# Patient Record
Sex: Female | Born: 1990 | Race: Black or African American | Hispanic: No | Marital: Single | Smoking: Never smoker
Health system: Southern US, Community
[De-identification: ages and names within clinical notes are randomized; demographics above are authoritative.]

## PROBLEM LIST (undated history)

## (undated) ENCOUNTER — Emergency Department: Admission: EM | Payer: Self-pay

## (undated) DIAGNOSIS — R519 Headache, unspecified: Secondary | ICD-10-CM

## (undated) DIAGNOSIS — N83209 Unspecified ovarian cyst, unspecified side: Secondary | ICD-10-CM

---

## 2017-01-06 ENCOUNTER — Emergency Department (HOSPITAL_BASED_OUTPATIENT_CLINIC_OR_DEPARTMENT_OTHER)
Admission: EM | Admit: 2017-01-06 | Discharge: 2017-01-06 | Disposition: A | Discharge status: Home / Self Care | Payer: Self-pay | Attending: Emergency Medicine | Admitting: Emergency Medicine

## 2017-01-06 ENCOUNTER — Emergency Department (HOSPITAL_BASED_OUTPATIENT_CLINIC_OR_DEPARTMENT_OTHER): Payer: Self-pay

## 2017-01-06 ENCOUNTER — Encounter (HOSPITAL_BASED_OUTPATIENT_CLINIC_OR_DEPARTMENT_OTHER): Payer: Self-pay | Admitting: Emergency Medicine

## 2017-01-06 DIAGNOSIS — N390 Urinary tract infection, site not specified: Secondary | ICD-10-CM | POA: Insufficient documentation

## 2017-01-06 DIAGNOSIS — G43109 Migraine with aura, not intractable, without status migrainosus: Secondary | ICD-10-CM

## 2017-01-06 DIAGNOSIS — G43809 Other migraine, not intractable, without status migrainosus: Secondary | ICD-10-CM | POA: Insufficient documentation

## 2017-01-06 HISTORY — DX: Unspecified ovarian cyst, unspecified side: N83.209

## 2017-01-06 LAB — PREGNANCY, URINE: Preg Test, Ur: NEGATIVE

## 2017-01-06 LAB — URINALYSIS, ROUTINE W REFLEX MICROSCOPIC
BILIRUBIN URINE: NEGATIVE
GLUCOSE, UA: NEGATIVE mg/dL
Hgb urine dipstick: NEGATIVE
KETONES UR: 15 mg/dL — AB
Nitrite: NEGATIVE
PH: 6 (ref 5.0–8.0)
Protein, ur: 100 mg/dL — AB
Specific Gravity, Urine: >1.03 — ABNORMAL HIGH (ref 1.005–1.030)

## 2017-01-06 LAB — URINALYSIS, MICROSCOPIC (REFLEX)

## 2017-01-06 MED ORDER — PROCHLORPERAZINE EDISYLATE 5 MG/ML IJ SOLN
5.0000 mg | Freq: Once | INTRAMUSCULAR | Status: DC
  Filled 2017-01-06: qty 2

## 2017-01-06 MED ORDER — SODIUM CHLORIDE 0.9 % IV BOLUS (SEPSIS)
500.0000 mL | Freq: Once | INTRAVENOUS | Status: AC
  Administered 2017-01-06: 500 mL via INTRAVENOUS

## 2017-01-06 MED ORDER — NITROFURANTOIN MONOHYD MACRO 100 MG PO CAPS: 100.0000 mg | ORAL_CAPSULE | Freq: Two times a day (BID) | ORAL | 0 refills | Status: DC

## 2017-01-06 MED ORDER — KETOROLAC TROMETHAMINE 30 MG/ML IJ SOLN
15.0000 mg | Freq: Once | INTRAMUSCULAR | Status: AC
  Administered 2017-01-06: 10:00:00 via INTRAVENOUS
  Filled 2017-01-06: qty 1

## 2017-01-06 MED ORDER — DIPHENHYDRAMINE HCL 50 MG/ML IJ SOLN: 12.5000 mg | Freq: Once | INTRAMUSCULAR | Status: DC

## 2017-01-06 MED ORDER — PROCHLORPERAZINE EDISYLATE 5 MG/ML IJ SOLN
5.0000 mg | Freq: Once | INTRAMUSCULAR | Status: AC
  Administered 2017-01-06: 5 mg via INTRAVENOUS

## 2017-01-06 MED FILL — NITROFURANTOIN MONO-MCR 100: 100 | 5 days supply | Qty: 10 | Fill #0

## 2017-01-06 NOTE — Discharge Instructions (Signed)

## 2017-01-06 NOTE — ED Notes (Signed)
Patient transported to CT 

## 2017-01-06 NOTE — ED Triage Notes (Signed)
H/A x 4-5 days and nausea, no appetite . Last took ibuporfen 200mg  at 0500

## 2017-01-06 NOTE — ED Provider Notes (Signed)
[facility] Note   CSN: [id number] Arrival date & time: 01/06/17  0801     History   Chief Complaint Chief Complaint  Patient presents with  . Headache    HPI Karen Harmon is a 26 y.o. female who presents emergency Department with chief complaint of headache. She states that she gets frequent tension headaches but normally they resolve with either Tylenol, Motrin, rest, will just go away on their own. She has no known history of migraine headaches. Patient states that the headache has been ongoing for 4 days without resolved even with over-the-counter medications. She denies weakness, numbness, changes in visual field. She has some mild mild nausea without vomiting. She's had a globus sensation with swallowing. She denies any urinary symptoms, vaginal symptoms.Denies photophobia, phonophobia, UL throbbing, N/V, visual changes, stiff neck, neck pain, rash, or "thunderclap" onset.  2  HPI  Past Medical History:  Diagnosis Date  . Ovarian cyst     There are no active problems to display for this patient.   History reviewed. No pertinent surgical history.  OB History    No data available       Home Medications    Prior to Admission medications   Not on File    Family History No family history on file.  Social History Social History  Substance Use Topics  . Smoking status: Never Smoker  . Smokeless tobacco: Never Used  . Alcohol use No     Allergies   Patient has no known allergies.   Review of Systems Review of Systems Ten systems reviewed and are negative for acute change, except as noted in the HPI.    Physical Exam Updated Vital Signs BP 120/78 (BP Location: Right Arm)   Pulse 84   Temp 98.5 F (36.9 C) (Oral)   Resp 18   Ht 4\' 11"  (1.499 m)   Wt 59 kg (130 lb)   LMP 12/29/2016 (Exact Date)   SpO2 100%   BMI 26.26 kg/m   Physical Exam  Constitutional: She is oriented to person, place, and time. She appears  well-developed and well-nourished. No distress.  HENT:  Head: Normocephalic and atraumatic.  Mouth/Throat: Oropharynx is clear and moist.  Eyes: Pupils are equal, round, and reactive to light. Conjunctivae and EOM are normal. No scleral icterus.  No horizontal, vertical or rotational nystagmus  Neck: Normal range of motion. Neck supple.  Full active and passive ROM without pain No midline or paraspinal tenderness No nuchal rigidity or meningeal signs  Cardiovascular: Normal rate, regular rhythm, normal heart sounds and intact distal pulses.  Exam reveals no gallop and no friction rub.   No murmur heard. Pulmonary/Chest: Effort normal and breath sounds normal. No respiratory distress. She has no wheezes. She has no rales.  Abdominal: Soft. Bowel sounds are normal. She exhibits no distension and no mass. There is no tenderness. There is no rebound and no guarding.  Musculoskeletal: Normal range of motion.  Lymphadenopathy:    She has no cervical adenopathy.  Neurological: She is alert and oriented to person, place, and time. No cranial nerve deficit. She exhibits normal muscle tone. Coordination normal.  Mental Status:  Alert, oriented, thought content appropriate. Speech fluent without evidence of aphasia. Able to follow 2 step commands without difficulty.  Cranial Nerves:  II:  Peripheral visual fields grossly normal, pupils equal, round, reactive to light III,IV, VI: ptosis not present, extra-ocular motions intact bilaterally  V,VII: smile symmetric, facial light touch sensation equal VIII:  hearing grossly normal bilaterally  IX,X: midline uvula rise  XI: bilateral shoulder shrug equal and strong XII: midline tongue extension   Motor:   4/5 in R upper Extremity; 5/5 in left and lower extremities bilaterally including strong and equal grip strength and dorsiflexion/plantar flexion  Sensory: Pinprick and light touch normal in all extremities.  Cerebellar: normal finger-to-nose with  bilateral upper extremities Gait: normal gait and balance CV: distal pulses palpable throughout   Skin: Skin is warm and dry. No rash noted. She is not diaphoretic.  Psychiatric: She has a normal mood and affect. Her behavior is normal. Judgment and thought content normal.  Nursing note and vitals reviewed.    ED Treatments / Results  Labs (all labs ordered are listed, but only abnormal results are displayed) Labs Reviewed  URINALYSIS, ROUTINE W REFLEX MICROSCOPIC  PREGNANCY, URINE    EKG  EKG Interpretation None       Radiology No results found.  Procedures Procedures (including critical care time)  Medications Ordered in ED Medications  sodium chloride 0.9 % bolus 500 mL (not administered)  ketorolac (TORADOL) 30 MG/ML injection 15 mg (not administered)  prochlorperazine (COMPAZINE) injection 5 mg (not administered)  diphenhydrAMINE (BENADRYL) injection 12.5 mg (not administered)     Initial Impression / Assessment and Plan / ED Course  I have reviewed the triage vital signs and the nursing notes.  Pertinent labs & imaging results that were available during my care of the patient were reviewed by me and considered in my medical decision making (see chart for details).  Clinical Course as of Jan 07 1643  Wed Jan 06, 2017  1154 Patient sxs have resolved. No weakness on exam.  [AH]  1202 I spoke with Dr. Wilford CornerArora. He agrees this sounds like a complicated migraine headache patient may be discharged with outpatient follow-up.  [AH]  1202 Patient U a may be contaminated. She denies any urinary symptoms. I will however treat for urinary tract infection.  [AH]    Clinical Course User Index [AH] Arthor CaptainHarris, Estell Dillinger, PA-C     [name] is a 26 y.o. female who presents to ED for  Headache. Migraine cocktail and fluids given with resolution of focal neuro deficits. Negative CT. I have Spoken with Dr. Wilford CornerArora who reccomends no further workup. The patient denies any neurologic  symptoms such as visual changes, focal numbness/weakness, balance problems, confusion, or speech difficulty to suggest a life-threatening intracranial process such as intracranial hemorrhage or mass. The patient has no clotting risk factors thus venous sinus thrombosis is unlikely. No fevers, neck pain or nuchal rigidity to suggest meningitis. I feel that the patient is safe for discharge home at this time. OP Neurology follow up strongly encouraged. I have reviewed return precautions including development of neurologic symptoms, confusion, lethargy, difficulty speaking, or new/worsening/concerning symptoms. All questions answered.  Final Clinical Impressions(s) / ED Diagnoses   Final diagnoses:  Complicated migraine  Urinary tract infection without hematuria, site unspecified    New Prescriptions New Prescriptions   No medications on file     Arthor CaptainHarris, Kaida Games, PA-C 01/06/17 1647    Tilden Fossaees, Elizabeth, MD 01/09/17 [phone]

## 2017-01-06 NOTE — ED Notes (Signed)
Given po fluids and snack 

## 2017-01-11 ENCOUNTER — Ambulatory Visit (INDEPENDENT_AMBULATORY_CARE_PROVIDER_SITE_OTHER): Payer: Self-pay | Admitting: Neurology

## 2017-01-11 ENCOUNTER — Telehealth: Payer: Self-pay | Admitting: Neurology

## 2017-01-11 ENCOUNTER — Encounter: Payer: Self-pay | Admitting: Neurology

## 2017-01-11 DIAGNOSIS — R0683 Snoring: Secondary | ICD-10-CM

## 2017-01-11 DIAGNOSIS — Z950 Presence of cardiac pacemaker: Secondary | ICD-10-CM | POA: Insufficient documentation

## 2017-01-11 DIAGNOSIS — R51 Headache: Secondary | ICD-10-CM

## 2017-01-11 DIAGNOSIS — R519 Headache, unspecified: Secondary | ICD-10-CM

## 2017-01-11 DIAGNOSIS — G4719 Other hypersomnia: Secondary | ICD-10-CM

## 2017-01-11 MED ORDER — ETODOLAC 400 MG PO TABS: 400.0000 mg | ORAL_TABLET | Freq: Two times a day (BID) | ORAL | 2 refills | Status: DC

## 2017-01-11 MED ORDER — PREDNISONE 10 MG PO TABS: 10.0000 mg | ORAL_TABLET | Freq: Every day | ORAL | 3 refills | Status: DC

## 2017-01-11 MED FILL — predniSONE 10 MG TABS: 10 | 6 days supply | Qty: 21 | Fill #0

## 2017-01-11 MED FILL — ETODOLAC 400 MG TABLET: 400 | 30 days supply | Qty: 60 | Fill #0

## 2017-01-11 NOTE — Telephone Encounter (Signed)
Karen Harmon with [facility] is calling tp get clarification on directions for predniSONE (DELTASONE) 10 MG tablet. Disregard previous message because patient changed pharmacies.

## 2017-01-11 NOTE — Telephone Encounter (Signed)
I have spoken with Karen Harmon at [facility] and clarified that Prednisone 10mg  #21 is a taper pack, starting with 6 tabs on day 1./fim

## 2017-01-11 NOTE — Telephone Encounter (Signed)
Darl PikesSusan with [facility] is calling regarding predniSONE (DELTASONE) 10 MG tablet that was sent in today for the patient.

## 2017-01-11 NOTE — Progress Notes (Signed)
[facility]  PATIENT: Karen Harmon DOB: [birth year]  REFERRING DOCTOR OR PCP:  None SOURCE: patient, ED note, imaging and lab reports, CT scan images on PACS  _________________________________   HISTORICAL  CHIEF COMPLAINT:  Chief Complaint  Patient presents with  . Headache    Karen Harmon is here for eval of  frontal/temporal h/a onset about 3 wks. ago. Seen in the ER on 01/06/17 for same and had neg. CT head.  No relief with otc meds. Worse at night and wakes her from sleep.  Sts. h/a still present, just less severe right now./fim      HISTORY OF PRESENT ILLNESS:  I had the pleasure seeing you patient, Karen Harmon, at [facility] for neurologic consultation regarding her headaches.  She is a 26 year old woman who began to experience headaches about 3 weeks ago.  She has been having HA's off/on for a few months but the current HA started 3 weeks ago without any trigger.   The pain is over her eyes and the eyes also hurt. There is occasional sharp pain in th back of the head.    Pain is worse on the left.   No activity worsens the pain.   Pain improves slightly with OTC NSAID's.   A couple years ago, she had a similar headache.    When pain is worse she notes some black spots in the vision.    She denies any numbness or weakness.   However, she felt off balance and was staggering so went to the ED 01/06/2017.  Marland Kitchen   When she went to the ED, she was noted to be slightly weak on her right (dominant side) on her exam.     Karen Harmon had not noted the weakness until it was pointed out.  Because of that, she was felt to have had a complicated migraine. She does not think that there has been any progression.I personally reviewed the CT scan of the head and it appears normal.  She has had syncope a couple times.   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   At the time, she reports she was on chemotherapy in [place] and coded.   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  She had a UTI in the ED treated with Macrobid.    She has a Band-Aid in the anterior neck and her mom states that she had a tracheostomy that typically pulled out after 1 day when she coded last month while receiving anesthesia for a pacemaker replacement. We don't have any details.  She snores and is often sleepy at night.   Her mom did not note that she snored when she was younger but she is 28 pounds heavier this year than 2 years ago (was 113 pounds during an ED visit in 2016).    EPWORTH SLEEPINESS SCALE  On a scale of 0 - 3 what is the chance of dozing:  Sitting and Reading:   0 Watching TV:    3 Sitting inactive in a public place: 1 Passenger in car for one hour: 0 Lying down to rest in the afternoon: 3 Sitting and talking to someone: 0 Sitting quietly after lunch:  3 In a car, stopped in traffic:  0  Total (out of 24):  10/24  (mild excessive daytime sleepiness)    REVIEW OF SYSTEMS: Constitutional: No fevers, chills, sweats, or change in appetite.  Eyes: No visual changes, double vision, eye pain Ear, nose and throat: No hearing loss, ear pain, nasal congestion, sore throat Cardiovascular: see above Respiratory: No shortness of breath at rest or with exertion.   No wheezes.   She snores  GastrointestinaI: No nausea, vomiting, diarrhea, abdominal pain, fecal incontinence Genitourinary: No dysuria, urinary retention or frequency.  No nocturia. Musculoskeletal: reports  neck pain, no back pain Integumentary: No rash, pruritus, skin lesions Neurological: as above Psychiatric: No depression at this time.  No anxiety Endocrine: No palpitations, diaphoresis, change in appetite, change in weigh or increased thirst Hematologic/Lymphatic: No anemia, purpura, petechiae. Allergic/Immunologic: No itchy/runny eyes, nasal congestion, recent allergic reactions, rashes  ALLERGIES: No Known Allergies  HOME  MEDICATIONS:  Current Outpatient Prescriptions:  .  aspirin-acetaminophen-caffeine (EXCEDRIN MIGRAINE) 250-250-65 MG tablet, Take by mouth every 6 (six) hours as needed for headache., Disp: , Rfl:  .  ibuprofen (ADVIL,MOTRIN) 200 MG tablet, Take 400 mg by mouth every 6 (six) hours as needed., Disp: , Rfl:  .  levonorgestrel-ethinyl estradiol (AVIANE,ALESSE,LESSINA) 0.1-20 MG-MCG tablet, Take by mouth., Disp: , Rfl:  .  nitrofurantoin, macrocrystal-monohydrate, (MACROBID) 100 MG capsule, Take 1 capsule (100 mg total) by mouth 2 (two) times daily., Disp: 10 capsule, Rfl: 0 .  etodolac (LODINE) 400 MG tablet, Take 1 tablet (400 mg total) by mouth 2 (two) times daily., Disp: 60 tablet, Rfl: 2 .  predniSONE (DELTASONE) 10 MG tablet, Take 1 tablet (10 mg total) by mouth daily with breakfast. Take over 6 days, starting with 6 pills po the first day., Disp: 21 tablet, Rfl: 3  PAST MEDICAL HISTORY: Past Medical History:  Diagnosis Date  . Ovarian cyst     PAST SURGICAL HISTORY: No past surgical history on file.  FAMILY HISTORY: No family history on file.  SOCIAL HISTORY:  Social History   Social History  . Marital status: Single    Spouse name: N/A  . Number of children: N/A  . Years of education: N/A   Occupational History  . Not on file.   Social History Main Topics  . Smoking status: Never Smoker  . Smokeless tobacco: Never Used  . Alcohol use No  . Drug use: No  . Sexual activity: Yes   Other Topics Concern  . Not on file   Social History Narrative  . No narrative on file     PHYSICAL EXAM  Vitals:   01/11/17 0813  BP: 115/69  Pulse: 88  Resp: 14  Weight: 141 lb (64 kg)  Height:  (1.499 m)    Body mass index is 28.48 kg/m.   General: The patient is well-developed and well-nourished and in no acute distress.   Mallampati 2-3.  Tonsils present,    Eyes:  Funduscopic exam shows normal optic discs and retinal vessels.  Neck: The neck is supple, no  carotid bruits are noted.  The neck is mildly tender at the occiput and the trapezius muscles and cervical paraspinal muscles.  Cardiovascular: The heart has a regular rate and rhythm with a normal S1 and S2. There were no murmurs, gallops or rubs. Lungs are clear to auscultation.  Ext: Extremities are without rash.   She has 1+ pedal edema.  Musculoskeletal:  Back is nontender  Neurologic Exam  Mental status: The patient is alert and oriented x 3 at the time of the examination. The patient has apparent normal recent and remote memory, with an apparently normal attention span and concentration ability.   Speech is  normal.  Cranial nerves: Extraocular movements are full. Pupils are equal, round, and reactive to light and accomodation.     Facial symmetry is present. There is good facial sensation to soft touch bilaterally.  Facial strength is normal.  Trapezius and sternocleidomastoid strength is normal. No dysarthria is noted.  The tongue is midline, and the patient has symmetric elevation of the soft palate. No obvious hearing deficits are noted.  Motor:  Muscle bulk is normal.   Tone is normal. Strength is 4+/5 in the right triceps, grip and finger extension and 5/5 in the deltoids.  Strength is  5 / 5 in the left arm and both legs.   Sensory: On sensory testing,  she reports decreased sensation to touch, temperature and vibration in the right arm and leg relative to the left.  Coordination: Cerebellar testing reveals good finger-nose-finger and heel-to-shin bilaterally.  Gait and station: Station is normal.   Gait is normal. Tandem gait is normal. Romberg is negative.   Reflexes: Deep tendon reflexes are symmetric and normal bilaterally.   Plantar responses are flexor.    DIAGNOSTIC DATA (LABS, IMAGING, TESTING) - I reviewed patient records, labs, notes, testing and imaging myself where available.     ASSESSMENT AND PLAN  Chronic daily headache  Snoring - Plan: Split night  study  Excessive daytime sleepiness - Plan: Split night study  Pacemaker     1.    She appears to have tension type headaches recent chronic daily headache. I will have her take a steroid pack followed by etodolac 400 mg by mouth twice a day. If that is not successful, consider a tricyclic or antiepileptic agent. 2.    There is mild weakness in the right arm. The history of a pacemaker and I don't have any details so cannot do an MRI.   She reports 2 episodes of cardiac or respiratory arrest where she was coded, and I do not know if this mild weakness could be a result one of those events. The CT scan did not show any evidence of stroke. 3.    Due to a combination of snoring, excessive daytime sleepiness, reported respiratory arrest during anesthesia and a crowded pharynx, she might have obstructive sleep apnea and we will check a sleep study. 4.    She will follow-up in a month but should call sooner if a new worsening neurologic symptoms.  Richard A. Epimenio FootSater, MD, [facility],FAAN 01/11/2017, 10:20 AM Certified in Neurology, Clinical Neurophysiology, Sleep Medicine, Pain Medicine and Neuroimaging  [facility] [address], [address] [place], [place] [address] [phone]

## 2017-02-15 ENCOUNTER — Emergency Department (HOSPITAL_BASED_OUTPATIENT_CLINIC_OR_DEPARTMENT_OTHER)
Admission: EM | Admit: 2017-02-15 | Discharge: 2017-02-16 | Disposition: A | Discharge status: Home / Self Care | Payer: Self-pay | Attending: Emergency Medicine | Admitting: Emergency Medicine

## 2017-02-15 ENCOUNTER — Encounter (HOSPITAL_BASED_OUTPATIENT_CLINIC_OR_DEPARTMENT_OTHER): Payer: Self-pay

## 2017-02-15 ENCOUNTER — Emergency Department (HOSPITAL_BASED_OUTPATIENT_CLINIC_OR_DEPARTMENT_OTHER): Payer: Self-pay

## 2017-02-15 DIAGNOSIS — K5900 Constipation, unspecified: Secondary | ICD-10-CM | POA: Insufficient documentation

## 2017-02-15 DIAGNOSIS — Z79899 Other long term (current) drug therapy: Secondary | ICD-10-CM | POA: Insufficient documentation

## 2017-02-15 LAB — CBC WITH DIFFERENTIAL/PLATELET
BASOS ABS: 0 10*3/uL (ref 0.0–0.1)
BASOS PCT: 0 %
EOS ABS: 0.1 10*3/uL (ref 0.0–0.7)
EOS PCT: 1 %
HCT: 38.1 % (ref 36.0–46.0)
Hemoglobin: 12.5 g/dL (ref 12.0–15.0)
LYMPHS ABS: 3.5 10*3/uL (ref 0.7–4.0)
Lymphocytes Relative: 40 %
MCH: 29.7 pg (ref 26.0–34.0)
MCHC: 32.8 g/dL (ref 30.0–36.0)
MCV: 90.5 fL (ref 78.0–100.0)
Monocytes Absolute: 0.7 10*3/uL (ref 0.1–1.0)
Monocytes Relative: 8 %
Neutro Abs: 4.4 10*3/uL (ref 1.7–7.7)
Neutrophils Relative %: 51 %
PLATELETS: 249 10*3/uL (ref 150–400)
RBC: 4.21 MIL/uL (ref 3.87–5.11)
RDW: 12.6 % (ref 11.5–15.5)
WBC: 8.7 10*3/uL (ref 4.0–10.5)

## 2017-02-15 MED ORDER — SODIUM CHLORIDE 0.9 % IV BOLUS (SEPSIS)
1000.0000 mL | Freq: Once | INTRAVENOUS | Status: AC
  Administered 2017-02-16: 1000 mL via INTRAVENOUS

## 2017-02-15 NOTE — ED Triage Notes (Signed)
C/o n/v x 2 days-NAD-steady gait 

## 2017-02-15 NOTE — ED Provider Notes (Signed)
[facility] EMERGENCY DEPARTMENT Provider Note   CSN: [id number] Arrival date & time: 02/15/17  2240     History   Chief Complaint Chief Complaint  Patient presents with  . Emesis    HPI Karen Harmon is a 26 y.o. female.  HPI  Patient, with a past medical history of ovarian cysts, presents to ED for evaluation of 2 day history of lower and upper abdominal pain and several episodes of vomiting. States that every time that she tries to eat anything she has an episode of vomiting after a few hours. She reports normal bowel movements with no history of constipation or diarrhea. She denies any previous abdominal surgeries, fever, sick contacts, abnormal vaginal bleeding, dysuria, vaginal discharge. She denies any chronic medical issues or daily medications.  Past Medical History:  Diagnosis Date  . Ovarian cyst     Patient Active Problem List   Diagnosis Date Noted  . Chronic daily headache 01/11/2017  . Snoring 01/11/2017  . Excessive daytime sleepiness 01/11/2017  . Pacemaker 01/11/2017    History reviewed. No pertinent surgical history.  OB History    No data available       Home Medications    Prior to Admission medications   Medication Sig Start Date End Date Taking? Authorizing Provider  aspirin-acetaminophen-caffeine (EXCEDRIN MIGRAINE) [phone] MG tablet Take by mouth every 6 (six) hours as needed for headache.    [provider]  docusate sodium (COLACE) 100 MG capsule Take 1 capsule (100 mg total) by mouth every 12 (twelve) hours. 02/16/17   Phil Michels, PA-C  etodolac (LODINE) 400 MG tablet Take 1 tablet (400 mg total) by mouth 2 (two) times daily. 01/11/17   Sater, Pearletha Furl, MD  ibuprofen (ADVIL,MOTRIN) 200 MG tablet Take 400 mg by mouth every 6 (six) hours as needed.    [provider]  ondansetron (ZOFRAN ODT) 4 MG disintegrating tablet Take 1 tablet (4 mg total) by mouth every 8 (eight) hours as needed for nausea or  vomiting. 02/16/17   Starlene Consuegra, PA-C  polyethylene glycol (MIRALAX / GLYCOLAX) packet Take 17 g by mouth daily. 02/16/17   Dietrich Pates, PA-C    Family History No family history on file.  Social History Social History  Substance Use Topics  . Smoking status: Never Smoker  . Smokeless tobacco: Never Used  . Alcohol use No     Allergies   Patient has no known allergies.   Review of Systems Review of Systems  Constitutional: Negative for appetite change, chills and fever.  HENT: Negative for ear pain, rhinorrhea, sneezing and sore throat.   Eyes: Negative for photophobia and visual disturbance.  Respiratory: Negative for cough, chest tightness, shortness of breath and wheezing.   Cardiovascular: Negative for chest pain and palpitations.  Gastrointestinal: Positive for abdominal pain, nausea and vomiting. Negative for blood in stool, constipation and diarrhea.  Genitourinary: Negative for dysuria, hematuria and urgency.  Musculoskeletal: Negative for myalgias.  Skin: Negative for rash.  Neurological: Negative for dizziness, weakness and light-headedness.     Physical Exam Updated Vital Signs BP 126/66 (BP Location: Left Arm)   Pulse 95   Temp 98.2 F (36.8 C) (Oral)   Resp 18   Ht  (1.499 m)   Wt 64.1 kg (141 lb 5 oz)   LMP 01/21/2017   SpO2 100%   BMI 28.54 kg/m   Physical Exam  Constitutional: She appears well-developed and well-nourished. No distress.  HENT:  Head: Normocephalic and  atraumatic.  Nose: Nose normal.  Eyes: Conjunctivae and EOM are normal. Left eye exhibits no discharge. No scleral icterus.  Neck: Normal range of motion. Neck supple.  Cardiovascular: Normal rate, regular rhythm, normal heart sounds and intact distal pulses.  Exam reveals no gallop and no friction rub.   No murmur heard. Pulmonary/Chest: Effort normal and breath sounds normal. No respiratory distress.  Abdominal: Soft. Bowel sounds are normal. She exhibits no distension.  There is tenderness. There is no guarding.    Musculoskeletal: Normal range of motion. She exhibits no edema.  Neurological: She is alert. She exhibits normal muscle tone. Coordination normal.  Skin: Skin is warm and dry. No rash noted.  Psychiatric: She has a normal mood and affect.  Nursing note and vitals reviewed.    ED Treatments / Results  Labs (all labs ordered are listed, but only abnormal results are displayed) Labs Reviewed  COMPREHENSIVE METABOLIC PANEL - Abnormal; Notable for the following:       Result Value   Glucose, Bld 102 (*)    All other components within normal limits  LIPASE, BLOOD  CBC WITH DIFFERENTIAL/PLATELET    EKG  EKG Interpretation None       Radiology Dg Abdomen 1 View  Result Date: 02/16/2017 CLINICAL DATA:  Nausea and vomiting x2 days with lower abdominal pain. EXAM: ABDOMEN - 1 VIEW COMPARISON:  None. FINDINGS: Increased colonic stool burden within the cecum and ascending colon as well as rectosigmoid. No bowel obstruction or free air. No radio-opaque calculi or other significant radiographic abnormality are seen. IMPRESSION: Increased colonic stool burden within the right colon and rectosigmoid. No bowel obstruction. No pathologic calcifications. Electronically Signed   By: Tollie Eth M.D.   On: 02/16/2017 00:38    Procedures Procedures (including critical care time)  Medications Ordered in ED Medications  sodium chloride 0.9 % bolus 1,000 mL (1,000 mLs Intravenous New Bag/Given 02/16/17 0001)  ondansetron (ZOFRAN) injection 4 mg (4 mg Intravenous Given 02/16/17 0055)     Initial Impression / Assessment and Plan / ED Course  I have reviewed the triage vital signs and the nursing notes.  Pertinent labs & imaging results that were available during my care of the patient were reviewed by me and considered in my medical decision making (see chart for details).     Patient presents to ED for evaluation of 2 day history of lower and  upper  Abdominal pain and several episodes of vomiting. She reports normal bowel movements with no history of constipation or diarrhea. She denies any fever, prior back surgeries, vaginal discharge, abnormal vaginal bleeding, dysuria, hematuria. On physical exam she is tender to palpation in the RUQ and suprapubic area bilaterally. She is afebrile with no history of fever. Vital signs unremarkable. Labwork including CMP, CBC, lipase unremarkable. Patient given 1 L of fluids here in the ED but is still unable to give a urine sample. She denies any pregnancy, concern for STDs. X-ray showed increased stool burden significant for constipation. She reports some improvement in her symptoms with fluids given here in the ED. I suspect that her symptoms are due to her constipation, rather than an acute intraabdominal process requiring surgical evaluation. Patient states she feels hungry but when offered crackers or ginger ale for PO challenge, she refused and stated she would rather go home and eat there. zofran given here in the ED. Will give Zofran for nausea, miralax and Colace for constipation. Advised to follow up with PCP for further  evaluation.  Of note, patient was evaluated by a neurologist approximately one month ago for persistent headaches. In note, stated that patient was being treated for leukemia in [place], had a prior tracheostomy, and had a pacemaker present. Note also stated that she coded while being evaluated for stem cell transplant. When asked about this, patient denies any of these findings or other past medical history or chronic medical issues. Note also stated there were no records to verify these findings. Unsure of patient's past medical history however based on today's imaging and lab work her symptoms are reassuring. Patient appears stable for discharge at this time. Strict return precautions given.  Final Clinical Impressions(s) / ED Diagnoses   Final diagnoses:  Constipation,  unspecified constipation type    New Prescriptions New Prescriptions   DOCUSATE SODIUM (COLACE) 100 MG CAPSULE    Take 1 capsule (100 mg total) by mouth every 12 (twelve) hours.   ONDANSETRON (ZOFRAN ODT) 4 MG DISINTEGRATING TABLET    Take 1 tablet (4 mg total) by mouth every 8 (eight) hours as needed for nausea or vomiting.   POLYETHYLENE GLYCOL (MIRALAX / GLYCOLAX) PACKET    Take 17 g by mouth daily.     Dietrich Pates, PA-C 02/16/17 1610    Geoffery Lyons, MD 02/16/17 [phone]

## 2017-02-16 LAB — COMPREHENSIVE METABOLIC PANEL
ALK PHOS: 45 U/L (ref 38–126)
ALT: 26 U/L (ref 14–54)
ANION GAP: 7 (ref 5–15)
AST: 33 U/L (ref 15–41)
Albumin: 4 g/dL (ref 3.5–5.0)
BILIRUBIN TOTAL: 0.5 mg/dL (ref 0.3–1.2)
BUN: 8 mg/dL (ref 6–20)
CALCIUM: 8.9 mg/dL (ref 8.9–10.3)
CO2: 25 mmol/L (ref 22–32)
Chloride: 104 mmol/L (ref 101–111)
Creatinine, Ser: 0.8 mg/dL (ref 0.44–1.00)
GLUCOSE: 102 mg/dL — AB (ref 65–99)
POTASSIUM: 3.7 mmol/L (ref 3.5–5.1)
Sodium: 136 mmol/L (ref 135–145)
TOTAL PROTEIN: 7.5 g/dL (ref 6.5–8.1)

## 2017-02-16 LAB — LIPASE, BLOOD: LIPASE: 32 U/L (ref 11–51)

## 2017-02-16 MED ORDER — ONDANSETRON HCL 4 MG/2ML IJ SOLN
4.0000 mg | Freq: Once | INTRAMUSCULAR | Status: AC
  Administered 2017-02-16: 4 mg via INTRAVENOUS
  Filled 2017-02-16: qty 2

## 2017-02-16 MED ORDER — ONDANSETRON 4 MG PO TBDP: 4.0000 mg | ORAL_TABLET | Freq: Three times a day (TID) | ORAL | 0 refills | Status: DC | PRN

## 2017-02-16 MED ORDER — DOCUSATE SODIUM 100 MG PO CAPS: 100.0000 mg | ORAL_CAPSULE | Freq: Two times a day (BID) | ORAL | 0 refills | Status: AC

## 2017-02-16 MED ORDER — ACETAMINOPHEN 325 MG PO TABS: 650.0000 mg | ORAL_TABLET | Freq: Once | ORAL | Status: DC

## 2017-02-16 MED ORDER — POLYETHYLENE GLYCOL 3350 17 G PO PACK: 17.0000 g | PACK | Freq: Every day | ORAL | 0 refills | Status: AC

## 2017-02-16 MED FILL — ONDANSETRON ODT 4 MG TABLET: 4 | 7 days supply | Qty: 20 | Fill #0

## 2017-02-16 NOTE — Discharge Instructions (Signed)
Please read attached information regarding your condition. Take Colace or MiraLAX as needed for constipation. Take Zofran as needed for nausea. Follow-up at [facility] for further evaluation. Return to ED for worsening pain, trouble breathing, trouble swallowing, increased vomiting, lightheadedness, loss of consciousness, blood in vomit.

## 2017-02-16 NOTE — ED Notes (Signed)
Requested urine specimen.  Pt sts she tried and was not able to go.

## 2017-02-22 ENCOUNTER — Telehealth: Payer: Self-pay | Admitting: Neurology

## 2017-02-22 NOTE — Telephone Encounter (Signed)
Spoke with pt and gave next available, but have also added her to the cancellation list I keep at my desk for RAS pt's, and will call when sooner appt. comes available./fim

## 2017-02-22 NOTE — Telephone Encounter (Signed)
Pt c/a appt for tomorrow 10/23, conflict with another appt she cannot change. Can you get her in sooner than January? Please call

## 2017-02-23 ENCOUNTER — Ambulatory Visit: Payer: Self-pay | Admitting: Neurology

## 2017-02-25 ENCOUNTER — Telehealth: Payer: Self-pay | Admitting: Neurology

## 2017-02-25 NOTE — Telephone Encounter (Signed)
We have attempted to call the patient 2 times to schedule sleep study. Patient has been unavailable at the phone numbers we have on file and has not returned our calls. At this point we will send a letter asking pt to please contact the sleep lab to schedule their sleep study. If patient calls back we will schedule them for their sleep study. ° °

## 2017-05-26 ENCOUNTER — Ambulatory Visit: Payer: Self-pay | Admitting: Neurology

## 2017-05-27 ENCOUNTER — Encounter: Payer: Self-pay | Admitting: Neurology

## 2017-06-27 ENCOUNTER — Encounter (HOSPITAL_BASED_OUTPATIENT_CLINIC_OR_DEPARTMENT_OTHER): Payer: Self-pay | Admitting: *Deleted

## 2017-06-27 ENCOUNTER — Other Ambulatory Visit: Payer: Self-pay

## 2017-06-27 ENCOUNTER — Emergency Department (HOSPITAL_BASED_OUTPATIENT_CLINIC_OR_DEPARTMENT_OTHER): Payer: Self-pay

## 2017-06-27 ENCOUNTER — Emergency Department (HOSPITAL_BASED_OUTPATIENT_CLINIC_OR_DEPARTMENT_OTHER)
Admission: EM | Admit: 2017-06-27 | Discharge: 2017-06-28 | Disposition: A | Discharge status: Home / Self Care | Payer: Self-pay | Attending: Emergency Medicine | Admitting: Emergency Medicine

## 2017-06-27 DIAGNOSIS — R519 Headache, unspecified: Secondary | ICD-10-CM

## 2017-06-27 DIAGNOSIS — G8929 Other chronic pain: Secondary | ICD-10-CM | POA: Insufficient documentation

## 2017-06-27 DIAGNOSIS — R0789 Other chest pain: Secondary | ICD-10-CM | POA: Insufficient documentation

## 2017-06-27 DIAGNOSIS — Z79899 Other long term (current) drug therapy: Secondary | ICD-10-CM | POA: Insufficient documentation

## 2017-06-27 DIAGNOSIS — R51 Headache: Secondary | ICD-10-CM | POA: Insufficient documentation

## 2017-06-27 LAB — CBC WITH DIFFERENTIAL/PLATELET
BASOS ABS: 0 10*3/uL (ref 0.0–0.1)
Basophils Relative: 1 %
EOS PCT: 1 %
Eosinophils Absolute: 0.1 10*3/uL (ref 0.0–0.7)
HCT: 36.5 % (ref 36.0–46.0)
Hemoglobin: 12.1 g/dL (ref 12.0–15.0)
Lymphocytes Relative: 47 %
Lymphs Abs: 3.6 10*3/uL (ref 0.7–4.0)
MCH: 29.9 pg (ref 26.0–34.0)
MCHC: 33.2 g/dL (ref 30.0–36.0)
MCV: 90.1 fL (ref 78.0–100.0)
MONO ABS: 0.8 10*3/uL (ref 0.1–1.0)
Monocytes Relative: 10 %
Neutro Abs: 3.2 10*3/uL (ref 1.7–7.7)
Neutrophils Relative %: 41 %
PLATELETS: 246 10*3/uL (ref 150–400)
RBC: 4.05 MIL/uL (ref 3.87–5.11)
RDW: 12.1 % (ref 11.5–15.5)
WBC: 7.7 10*3/uL (ref 4.0–10.5)

## 2017-06-27 LAB — TROPONIN I: Troponin I: <0.03 ng/mL (ref <0.03)

## 2017-06-27 LAB — RAPID URINE DRUG SCREEN, HOSP PERFORMED
Amphetamines: NOT DETECTED
Barbiturates: NOT DETECTED
Benzodiazepines: NOT DETECTED
Cocaine: NOT DETECTED
OPIATES: NOT DETECTED
TETRAHYDROCANNABINOL: NOT DETECTED

## 2017-06-27 LAB — BASIC METABOLIC PANEL
Anion gap: 8 (ref 5–15)
BUN: 14 mg/dL (ref 6–20)
CO2: 26 mmol/L (ref 22–32)
CREATININE: 0.93 mg/dL (ref 0.44–1.00)
Calcium: 9 mg/dL (ref 8.9–10.3)
Chloride: 104 mmol/L (ref 101–111)
GFR calc Af Amer: >60 mL/min (ref >60)
GLUCOSE: 88 mg/dL (ref 65–99)
Potassium: 3.6 mmol/L (ref 3.5–5.1)
Sodium: 138 mmol/L (ref 135–145)

## 2017-06-27 LAB — PREGNANCY, URINE: Preg Test, Ur: NEGATIVE

## 2017-06-27 LAB — D-DIMER, QUANTITATIVE: D-Dimer, Quant: <0.27 ug/mL-FEU (ref 0.00–0.50)

## 2017-06-27 MED ORDER — PROCHLORPERAZINE EDISYLATE 5 MG/ML IJ SOLN
10.0000 mg | Freq: Once | INTRAMUSCULAR | Status: AC
  Administered 2017-06-28: 10 mg via INTRAVENOUS
  Filled 2017-06-27: qty 2

## 2017-06-27 MED ORDER — DIPHENHYDRAMINE HCL 50 MG/ML IJ SOLN
25.0000 mg | Freq: Once | INTRAMUSCULAR | Status: AC
  Administered 2017-06-28: 25 mg via INTRAVENOUS
  Filled 2017-06-27: qty 1

## 2017-06-27 MED ORDER — SODIUM CHLORIDE 0.9 % IV BOLUS (SEPSIS)
1000.0000 mL | Freq: Once | INTRAVENOUS | Status: AC
  Administered 2017-06-27: 1000 mL via INTRAVENOUS

## 2017-06-27 NOTE — ED Notes (Signed)
Pt given cup to attempt urine sample. 

## 2017-06-27 NOTE — ED Notes (Signed)
EMT went to hook pt up to the cardiac monitor and as soon as the EMT started to hook the pt up to the monitor the pt started making herself breathe faster. RR are still within normal range. Monitor reading 20RR.

## 2017-06-27 NOTE — ED Notes (Signed)
Pt seen in triage by this RT. Pt in no distress. Instructed pt to take a deep breath so I could asculate her lungs,  Pt increased resperrations taking even move shallow breaths. Will wait further orders.

## 2017-06-27 NOTE — ED Triage Notes (Addendum)
Pt reports chest pain and SOB since 8pm. Pt states pain started while she was "lying on her bed". Pt with rapid shallow respirations in triage (40-60/min). Encouraged to slow breathing. BBS clear. Pt denies anxiety. Answers questions in low quiet voice. Denies stress or unusual circumstances. States she has a "really bad headache"

## 2017-06-27 NOTE — ED Notes (Signed)
Asked patient if they could attempt to use the bathroom to provide urine sample. Patient stated that she did not have to go right now.

## 2017-06-28 MED ORDER — KETOROLAC TROMETHAMINE 30 MG/ML IJ SOLN
15.0000 mg | Freq: Once | INTRAMUSCULAR | Status: AC
  Administered 2017-06-28: 15 mg via INTRAVENOUS
  Filled 2017-06-28: qty 1

## 2017-06-28 NOTE — Discharge Instructions (Signed)
Your lab work has been reassuring. You need to follow up with a primary care doctor. You can return to the Ed if you develop any worsening symptoms.

## 2017-06-28 NOTE — ED Notes (Signed)
Patient became upset stating that she wants to go home. Patient is driving herself home.   She pulled her IV out but EDP would not allow her to go home d/t Benadryl that she got at 0027.   After a good talked by EDP, patient decided to stay until 0230 as per EDP suggestion.  Report given to receiving nurse.

## 2017-06-28 NOTE — ED Provider Notes (Signed)
[facility] EMERGENCY DEPARTMENT Provider Note   CSN: [id number] Arrival date & time: 06/27/17  2108     History   Chief Complaint Chief Complaint  Patient presents with  . Chest Pain    HPI Karen Harmon is a 27 y.o. female.  HPI 27 year old African-American female past medical history significant for chronic headaches presents to the emergency department today with vague symptoms.  Patient's history seems to be all over the place.  She does report substernal chest pain that does not radiate with associated shortness of breath with increased work of breathing that started yesterday however she did tell the triage nurse that her symptoms started a few hours ago.  Patient also reports a headache that has been ongoing for 4 days.  States it has gradually worsened.  Denies any red flag symptoms including maximal in onset or acute in onset.  Patient denies any associated fevers, chills, neck stiffness, vision changes, syncope, lightheadedness.  She does report some intermittent dizziness.  Patient reports history of chronic headaches.  Uncontrolled by Tylenol at home.  Patient states that her breathing is very rapid.  Patient denies any history of DVT/PE, prolonged immobilization, recent hospitalization/surgeries, unilateral leg swelling or calf tenderness, OCP use.  Denies any cardiac history.  Denies any associated diaphoresis, nausea or emesis.  She has not taking for symptoms prior to arrival.  Pt denies any fever, chill, vision changes,  dizziness, congestion, neck pain,cough, abd pain, n/v/d, urinary symptoms, change in bowel habits, melena, hematochezia, lower extremity paresthesias.  Past Medical History:  Diagnosis Date  . Ovarian cyst     Patient Active Problem List   Diagnosis Date Noted  . Chronic daily headache 01/11/2017  . Snoring 01/11/2017  . Excessive daytime sleepiness 01/11/2017  . Pacemaker 01/11/2017    History reviewed. No pertinent surgical  history.  OB History    No data available       Home Medications    Prior to Admission medications   Medication Sig Start Date End Date Taking? Authorizing Provider  aspirin-acetaminophen-caffeine (EXCEDRIN MIGRAINE) [phone] MG tablet Take by mouth every 6 (six) hours as needed for headache.    [provider]  docusate sodium (COLACE) 100 MG capsule Take 1 capsule (100 mg total) by mouth every 12 (twelve) hours. 02/16/17   Khatri, Hina, PA-C  etodolac (LODINE) 400 MG tablet Take 1 tablet (400 mg total) by mouth 2 (two) times daily. 01/11/17   Sater, Pearletha Furl, MD  ibuprofen (ADVIL,MOTRIN) 200 MG tablet Take 400 mg by mouth every 6 (six) hours as needed.    [provider]  ondansetron (ZOFRAN ODT) 4 MG disintegrating tablet Take 1 tablet (4 mg total) by mouth every 8 (eight) hours as needed for nausea or vomiting. 02/16/17   Khatri, Hina, PA-C  polyethylene glycol (MIRALAX / GLYCOLAX) packet Take 17 g by mouth daily. 02/16/17   Dietrich Pates, PA-C    Family History No family history on file.  Social History Social History   Tobacco Use  . Smoking status: Never Smoker  . Smokeless tobacco: Never Used  Substance Use Topics  . Alcohol use: No  . Drug use: No     Allergies   Patient has no known allergies.   Review of Systems Review of Systems  Constitutional: Negative for chills, diaphoresis and fever.  HENT: Negative for congestion.   Eyes: Negative for visual disturbance.  Respiratory: Positive for shortness of breath. Negative for cough.   Cardiovascular: Positive for chest  pain. Negative for palpitations and leg swelling.  Gastrointestinal: Negative for abdominal pain, diarrhea, nausea and vomiting.  Genitourinary: Negative for dysuria, flank pain, frequency, hematuria and urgency.  Musculoskeletal: Negative for arthralgias and myalgias.  Skin: Negative for rash.  Neurological: Positive for dizziness and headaches. Negative for syncope, weakness,  light-headedness and numbness.  Psychiatric/Behavioral: Negative for sleep disturbance. The patient is not nervous/anxious.      Physical Exam Updated Vital Signs BP 128/82 (BP Location: Right Arm)   Pulse 93   Temp 98.5 F (36.9 C)   Resp (!) 26   Ht 4\' 11"  (1.499 m)   Wt 60.8 kg (134 lb 0.6 oz)   LMP 06/20/2017 (Approximate)   SpO2 100%   BMI 27.07 kg/m   Physical Exam  Constitutional: She is oriented to person, place, and time. She appears well-developed and well-nourished.  Non-toxic appearance. No distress.  HENT:  Head: Normocephalic and atraumatic.  Nose: Nose normal.  Mouth/Throat: Oropharynx is clear and moist.  Eyes: Conjunctivae are normal. Pupils are equal, round, and reactive to light. Right eye exhibits no discharge. Left eye exhibits no discharge.  Neck: Normal range of motion. Neck supple. No JVD present. No tracheal deviation present.  No c spine midline tenderness. No paraspinal tenderness. No deformities or step offs noted. Full ROM. Supple. No nuchal rigidity.    Cardiovascular: Normal rate, regular rhythm, normal heart sounds and intact distal pulses. Exam reveals no gallop and no friction rub.  No murmur heard. Pulses:      Radial pulses are 2+ on the right side, and 2+ on the left side.       Dorsalis pedis pulses are 2+ on the right side, and 2+ on the left side.  All extremities are warm to touch.  Pulses are equal in all extremities.  Pulmonary/Chest: Effort normal and breath sounds normal. Tachypnea noted. No respiratory distress. She has no decreased breath sounds. She has no wheezes. She has no rhonchi. She has no rales. She exhibits tenderness.  No hypoxia.  Patient with mild tachypnea.  She appears to be taking short shallow breaths however when you are not looking or when patient is distracted she stops.  Abdominal: Soft. Bowel sounds are normal. She exhibits no distension. There is no tenderness. There is no rebound and no guarding.    Musculoskeletal: Normal range of motion.       Right lower leg: She exhibits no edema.       Left lower leg: She exhibits no edema.  No lower extremity edema or calf tenderness.  Lymphadenopathy:    She has no cervical adenopathy.  Neurological: She is alert and oriented to person, place, and time.  Skin: Skin is warm and dry. Capillary refill takes less than 2 seconds. She is not diaphoretic.  Psychiatric: Her behavior is normal. Judgment and thought content normal.  Nursing note and vitals reviewed.    ED Treatments / Results  Labs (all labs ordered are listed, but only abnormal results are displayed) Labs Reviewed  PREGNANCY, URINE  BASIC METABOLIC PANEL  CBC WITH DIFFERENTIAL/PLATELET  D-DIMER, QUANTITATIVE (NOT AT [facility])  TROPONIN I  RAPID URINE DRUG SCREEN, HOSP PERFORMED    EKG  EKG Interpretation  Date/Time:  Sunday June 27 2017 21:19:53 EST Ventricular Rate:  108 PR Interval:  142 QRS Duration: 68 QT Interval:  328 QTC Calculation: 439 R Axis:   82 Text Interpretation:  Sinus tachycardia Nonspecific T wave abnormality Abnormal ECG no prior available Confirmed by  Meridee Score [phone]) on 06/27/2017 10:32:21 PM       Radiology Dg Chest 2 View  Result Date: 06/27/2017 CLINICAL DATA:  Acute chest pain and shortness of breath for 2 hours. EXAM: CHEST  2 VIEW COMPARISON:  None. FINDINGS: The cardiomediastinal silhouette is unremarkable. There is no evidence of focal airspace disease, pulmonary edema, suspicious pulmonary nodule/mass, pleural effusion, or pneumothorax. No acute bony abnormalities are identified. IMPRESSION: No active cardiopulmonary disease. Electronically Signed   By: Harmon Pier M.D.   On: 06/27/2017 22:02    Procedures Procedures (including critical care time)  Medications Ordered in ED Medications  sodium chloride 0.9 % bolus 1,000 mL (0 mLs Intravenous Stopped 06/28/17 0024)  diphenhydrAMINE (BENADRYL) injection 25 mg (25 mg Intravenous  Given 06/28/17 0027)  prochlorperazine (COMPAZINE) injection 10 mg (10 mg Intravenous Given 06/28/17 0027)  ketorolac (TORADOL) 30 MG/ML injection 15 mg (15 mg Intravenous Given 06/28/17 0031)     Initial Impression / Assessment and Plan / ED Course  I have reviewed the triage vital signs and the nursing notes.  Pertinent labs & imaging results that were available during my care of the patient were reviewed by me and considered in my medical decision making (see chart for details).     She presents to the ED with very vague symptoms of chest pain and headache.  She has told different stories to the triage nurse and myself.  Patient appears to be having rapid shallow breaths when assessing her and talking with her however when she is distracted or when no one is looking she is not having any rapid breathing.  Patient reports history of chronic headaches.  She does report having a headache at this time.  No red flag symptoms concerning for SAH, ICH, meningitis.  Chest pain is not likely of cardiac or pulmonary etiology d/t presentation, d-dimer negative, VSS, no tracheal deviation, no JVD or new murmur, RRR, breath sounds equal bilaterally, EKG without any change from prior tracing and shows no signs of ischemia, negative troponin, and negative CXR.  Heart pathway score is 1.  Very atypical for ACS.  Doubt PE, dissection or pneumonia.  Lab work reassuring.  No leukocytosis.  Kidney function normal.  Normal electrolytes.  Pt has been advised to return to the ED is CP becomes exertional, associated with diaphoresis or nausea, radiates to left jaw/arm, worsens or becomes concerning in any way.  Unknown etiology of patient's symptoms.  She has had a thorough workup in the ED.  She was given migraine cocktail given history of headaches.  To reassess patient states that she was tired of waiting and that we were not helping her.  She states that she can just wait in her bed at home and we are not going to  do anything further.  Asked how her headache was and she states that it is still there.  She states that she wants to leave.  Patient does appear agitated possibly due to the Compazine however she was given Benadryl.  Patient is requesting to leave.  However patient was given Benadryl at 0027.  Instructed patient that we can get her a taxi ride home.  She has no when she can call to provide a ride.  Patient states that she would just stay here for a few hours and then we can discharge her.  Very much well-appearing.  Normal vital signs with very thorough workup.  Do not feel that any further emergent management necessary at this time.  Discussed outpatient follow-up.  Pt is hemodynamically stable, in NAD, & able to ambulate in the ED. Evaluation does not show pathology that would require ongoing emergent intervention or inpatient treatment. I explained the diagnosis to the patient. Pain has been managed & has no complaints prior to dc. Pt is comfortable with above plan and is stable for discharge at this time. All questions were answered prior to disposition. Strict return precautions for f/u to the ED were discussed. Encouraged follow up with PCP.   Final Clinical Impressions(s) / ED Diagnoses   Final diagnoses:  Atypical chest pain  Chronic nonintractable headache, unspecified headache type    ED Discharge Orders    None       Wallace KellerLeaphart, Javin Nong T, PA-C 06/28/17 0224    Terrilee FilesButler, Michael C, MD 06/28/17 [phone]

## 2017-06-28 NOTE — ED Notes (Signed)
Pt given d/c instructions as per chart. Verbalizes understanding. No questions. 

## 2018-03-21 ENCOUNTER — Encounter (HOSPITAL_COMMUNITY): Payer: Self-pay | Admitting: Emergency Medicine

## 2018-03-21 ENCOUNTER — Emergency Department (HOSPITAL_COMMUNITY)
Admission: EM | Admit: 2018-03-21 | Discharge: 2018-03-21 | Disposition: A | Discharge status: Home / Self Care | Payer: Self-pay | Attending: Emergency Medicine | Admitting: Emergency Medicine

## 2018-03-21 ENCOUNTER — Other Ambulatory Visit: Payer: Self-pay

## 2018-03-21 DIAGNOSIS — Y929 Unspecified place or not applicable: Secondary | ICD-10-CM | POA: Insufficient documentation

## 2018-03-21 DIAGNOSIS — Z23 Encounter for immunization: Secondary | ICD-10-CM | POA: Insufficient documentation

## 2018-03-21 DIAGNOSIS — Y9389 Activity, other specified: Secondary | ICD-10-CM | POA: Insufficient documentation

## 2018-03-21 DIAGNOSIS — Z950 Presence of cardiac pacemaker: Secondary | ICD-10-CM | POA: Insufficient documentation

## 2018-03-21 DIAGNOSIS — S61012A Laceration without foreign body of left thumb without damage to nail, initial encounter: Secondary | ICD-10-CM | POA: Insufficient documentation

## 2018-03-21 DIAGNOSIS — Y999 Unspecified external cause status: Secondary | ICD-10-CM | POA: Insufficient documentation

## 2018-03-21 DIAGNOSIS — Z79899 Other long term (current) drug therapy: Secondary | ICD-10-CM | POA: Insufficient documentation

## 2018-03-21 DIAGNOSIS — W268XXA Contact with other sharp object(s), not elsewhere classified, initial encounter: Secondary | ICD-10-CM | POA: Insufficient documentation

## 2018-03-21 MED ORDER — TETANUS-DIPHTH-ACELL PERTUSSIS 5-2.5-18.5 LF-MCG/0.5 IM SUSP
0.5000 mL | Freq: Once | INTRAMUSCULAR | Status: AC
  Administered 2018-03-21: 0.5 mL via INTRAMUSCULAR
  Filled 2018-03-21: qty 0.5

## 2018-03-21 NOTE — ED Triage Notes (Signed)
Patient reports cutting left thumb with can opener yesterday. Approximately half inch laceration to thumb. Movement and sensation to thumb. Unknown last tetanus shot.

## 2018-03-21 NOTE — Discharge Instructions (Signed)
Please read and follow all provided instructions.  Your diagnoses today include:  1. Laceration of left thumb, foreign body presence unspecified, nail damage status unspecified, initial encounter     Tests performed today include:  Vital signs. See below for your results today.   Medications prescribed:   None  Take any prescribed medications only as directed.   Home care instructions:  Follow any educational materials and wound care instructions contained in this packet.   Keep affected area above the level of your heart when possible to minimize swelling. Wash area gently twice a day with warm soapy water. Do not apply alcohol or hydrogen peroxide. Cover the area if it draining or weeping.   Return instructions:  Return to the Emergency Department if you have:  Fever  Worsening pain  Worsening swelling of the wound  Pus draining from the wound  Redness of the skin that moves away from the wound, especially if it streaks away from the affected area   Any other emergent concerns  Your vital signs today were: BP 114/84 (BP Location: Right Arm)    Pulse 90    Temp 98.3 F (36.8 C) (Oral)    Resp 18    LMP 03/07/2018 (Approximate)    SpO2 96%  If your blood pressure (BP) was elevated above 135/85 this visit, please have this repeated by your doctor within one month. --------------

## 2018-03-21 NOTE — ED Provider Notes (Signed)
[facility]-EMERGENCY DEPT Provider Note   CSN: [id number] Arrival date & time: 03/21/18  1059     History   Chief Complaint Chief Complaint  Patient presents with  . Laceration    HPI Karen Harmon is a 27 y.o. female.  Patient presents the emergency department with complaint of laceration sustained at 5:30 PM yesterday when she was opening a metal can.  Patient sustained a cut across the pad of her left thumb.  She ran it under cold water and applied a Band-Aid.  No other treatments prior to arrival.  Last tetanus is unknown.  She is able to bend the thumb but it hurts.  No other injury.  No numbness or tingling.     Past Medical History:  Diagnosis Date  . Ovarian cyst     Patient Active Problem List   Diagnosis Date Noted  . Chronic daily headache 01/11/2017  . Snoring 01/11/2017  . Excessive daytime sleepiness 01/11/2017  . Pacemaker 01/11/2017    History reviewed. No pertinent surgical history.   OB History   None      Home Medications    Prior to Admission medications   Medication Sig Start Date End Date Taking? Authorizing Provider  aspirin-acetaminophen-caffeine (EXCEDRIN MIGRAINE) [phone] MG tablet Take by mouth every 6 (six) hours as needed for headache.    [provider]  docusate sodium (COLACE) 100 MG capsule Take 1 capsule (100 mg total) by mouth every 12 (twelve) hours. 02/16/17   Khatri, Hina, PA-C  etodolac (LODINE) 400 MG tablet Take 1 tablet (400 mg total) by mouth 2 (two) times daily. 01/11/17   Sater, Pearletha Furl, MD  ibuprofen (ADVIL,MOTRIN) 200 MG tablet Take 400 mg by mouth every 6 (six) hours as needed.    [provider]  ondansetron (ZOFRAN ODT) 4 MG disintegrating tablet Take 1 tablet (4 mg total) by mouth every 8 (eight) hours as needed for nausea or vomiting. 02/16/17   Khatri, Hina, PA-C  polyethylene glycol (MIRALAX / GLYCOLAX) packet Take 17 g by mouth daily. 02/16/17   Dietrich Pates, PA-C     Family History No family history on file.  Social History Social History   Tobacco Use  . Smoking status: Never Smoker  . Smokeless tobacco: Never Used  Substance Use Topics  . Alcohol use: No  . Drug use: No     Allergies   Patient has no known allergies.   Review of Systems Review of Systems  Constitutional: Negative for activity change.  Musculoskeletal: Negative for arthralgias, back pain, joint swelling and neck pain.  Skin: Positive for wound.  Neurological: Negative for weakness and numbness.     Physical Exam Updated Vital Signs BP 114/84 (BP Location: Right Arm)   Pulse 90   Temp 98.3 F (36.8 C) (Oral)   Resp 18   LMP 03/07/2018 (Approximate)   SpO2 96%   Physical Exam  Constitutional: She appears well-developed and well-nourished.  HENT:  Head: Normocephalic and atraumatic.  Eyes: Pupils are equal, round, and reactive to light.  Neck: Normal range of motion. Neck supple.  Cardiovascular: Normal pulses. Exam reveals no decreased pulses.  Musculoskeletal: She exhibits tenderness. She exhibits no edema.  Approximately 1 cm well approximated laceration noted to the pad of the left thumb.  No evidence of infection or drainage at this time.  Patient is able to flex the thumb at the IP joint with some discomfort.  Capillary refill is less than 2 seconds.  Distal  sensation intact.  Neurological: She is alert. No sensory deficit.  Motor, sensation, and vascular distal to the injury is fully intact.   Skin: Skin is warm and dry.  Psychiatric: She has a normal mood and affect.  Nursing note and vitals reviewed.    ED Treatments / Results  Labs (all labs ordered are listed, but only abnormal results are displayed) Labs Reviewed - No data to display  EKG None  Radiology No results found.  Procedures Procedures (including critical care time)  Medications Ordered in ED Medications  Tdap (BOOSTRIX) injection 0.5 mL (0.5 mLs Intramuscular Given  03/21/18 1250)     Initial Impression / Assessment and Plan / ED Course  I have reviewed the triage vital signs and the nursing notes.  Pertinent labs & imaging results that were available during my care of the patient were reviewed by me and considered in my medical decision making (see chart for details).     Patient seen and examined.   Vital signs reviewed and are as follows: BP 114/84 (BP Location: Right Arm)   Pulse 90   Temp 98.3 F (36.8 C) (Oral)   Resp 18   LMP 03/07/2018 (Approximate)   SpO2 96%   Wound evaluated.  At this point, greater than 12 hours after the laceration, I would not place stitches.  Also, wound is already well approximated and is not gaping.  Do not feel that further intervention is indicated.  No signs of infection.  Tetanus will be updated.  Patient counseled on wound care and signs and symptoms to return.  Patient was urged to return to the Emergency Department urgently with worsening pain, swelling, expanding erythema especially if it streaks away from the affected area, fever, or if they have any other concerns. Patient verbalized understanding.    Final Clinical Impressions(s) / ED Diagnoses   Final diagnoses:  Laceration of left thumb, foreign body presence unspecified, nail damage status unspecified, initial encounter   Well approximated thumb laceration now over 18 hours old.  I do not suspect a flexor tendon injury given good strength and range of motion at the IP joint.  Wound will need to be closely monitored by the patient at home.  Return instructions as above.  Otherwise standard wound care indicated at this point.  Do not suspect any significant nerve injury however discussed with patient that she may have some numbness, tingling, or sensitivity as the wound heals.  Encouraged to follow-up with PCP with any problems regarding these symptoms.  ED Discharge Orders    None       Renne CriglerGeiple, Nolyn Swab, Cordelia Poche-C 03/21/18 1302    Pricilla LovelessGoldston,  Scott, MD 03/21/18 [phone]

## 2018-03-21 NOTE — ED Notes (Signed)
Bed: WTR7 Expected date:  Expected time:  Means of arrival:  Comments: 

## 2018-06-29 ENCOUNTER — Emergency Department (HOSPITAL_BASED_OUTPATIENT_CLINIC_OR_DEPARTMENT_OTHER)
Admission: EM | Admit: 2018-06-29 | Discharge: 2018-06-29 | Disposition: A | Discharge status: Home / Self Care | Payer: Self-pay | Attending: Emergency Medicine | Admitting: Emergency Medicine

## 2018-06-29 ENCOUNTER — Other Ambulatory Visit: Payer: Self-pay

## 2018-06-29 ENCOUNTER — Emergency Department (HOSPITAL_BASED_OUTPATIENT_CLINIC_OR_DEPARTMENT_OTHER): Payer: Self-pay

## 2018-06-29 ENCOUNTER — Encounter (HOSPITAL_BASED_OUTPATIENT_CLINIC_OR_DEPARTMENT_OTHER): Payer: Self-pay | Admitting: Emergency Medicine

## 2018-06-29 DIAGNOSIS — R69 Illness, unspecified: Secondary | ICD-10-CM

## 2018-06-29 DIAGNOSIS — A599 Trichomoniasis, unspecified: Secondary | ICD-10-CM | POA: Insufficient documentation

## 2018-06-29 DIAGNOSIS — J111 Influenza due to unidentified influenza virus with other respiratory manifestations: Secondary | ICD-10-CM | POA: Insufficient documentation

## 2018-06-29 DIAGNOSIS — Z79899 Other long term (current) drug therapy: Secondary | ICD-10-CM | POA: Insufficient documentation

## 2018-06-29 DIAGNOSIS — Z950 Presence of cardiac pacemaker: Secondary | ICD-10-CM | POA: Insufficient documentation

## 2018-06-29 LAB — URINALYSIS, MICROSCOPIC (REFLEX)

## 2018-06-29 LAB — PREGNANCY, URINE: PREG TEST UR: NEGATIVE

## 2018-06-29 LAB — URINALYSIS, ROUTINE W REFLEX MICROSCOPIC
Bilirubin Urine: NEGATIVE
Glucose, UA: NEGATIVE mg/dL
Ketones, ur: 15 mg/dL — AB
Nitrite: NEGATIVE
PROTEIN: NEGATIVE mg/dL
SPECIFIC GRAVITY, URINE: 1.01 (ref 1.005–1.030)
pH: 7 (ref 5.0–8.0)

## 2018-06-29 MED ORDER — ACETAMINOPHEN 325 MG PO TABS
650.0000 mg | ORAL_TABLET | Freq: Once | ORAL | Status: AC
  Administered 2018-06-29: 650 mg via ORAL
  Filled 2018-06-29: qty 2

## 2018-06-29 MED ORDER — ACETAMINOPHEN 325 MG PO TABS
ORAL_TABLET | ORAL | Status: AC
  Filled 2018-06-29: qty 1

## 2018-06-29 MED ORDER — BENZONATATE 100 MG PO CAPS: 100.0000 mg | ORAL_CAPSULE | Freq: Three times a day (TID) | ORAL | 0 refills | Status: DC

## 2018-06-29 MED ORDER — METRONIDAZOLE 500 MG PO TABS: 500.0000 mg | ORAL_TABLET | Freq: Two times a day (BID) | ORAL | 0 refills | Status: DC

## 2018-06-29 MED FILL — BENZONATATE 100 MG CAP: 100 | 7 days supply | Qty: 21 | Fill #0

## 2018-06-29 NOTE — Discharge Instructions (Addendum)
At this time you appear to have flu-like symptoms.  Treatment of symptoms is the best plan.  I have prescribed you a medicine for cough (tessalon). Take tylenol or motrin for aches and pains, gargling with salt water for sore throat, increased fluid intake, especially hot drinks to soothe the throat.  Over the counter medications that include decongestants may also help.  Expect to have symptoms for 5-10 days.  Return to the ER for worsening condition or new concerning symptoms.  1. Medications: tessalon for cough, metronidazole (antibiotic for Trichomonas), usual home medications 2. Treatment: rest, drink plenty of fluids, take tylenol or ibuprofen for fever control 3. Follow Up: Please follow up with your primary doctor in 3 days for discussion of your diagnoses and further evaluation after today's visit; if you do not have a primary care doctor use the resource guide provided to find one; Return to the ER for high fevers, difficulty breathing or other concerning symptoms  Continue to stay well-hydrated. Gargle warm salt water and spit it out for sore throat. Take benadryl or other antihistamine to decrease secretions and for watery itchy eyes. Continued to alternate between Tylenol and ibuprofen for pain. Follow up with your primary care doctor in 5-7 days or referral for urgent care for recheck of ongoing symptoms however return to emergency department for emergent changing or worsening of symptoms.   Read the instructions below on reasons to return to the emergency department and to learn more about your diagnosis.  Use over the counter medications for symptomatic relief as we discussed (mucinex as a decongestant, Tylenol for fever/pain, Motrin/Ibuprofen for muscle aches). If prescribed a cough suppressant during your visit, do not operate heavy machinery with in 5 hours of taking this medication. Follow up with your primary care doctor in 4 days if your symptoms persist.  Your more than welcome to  return to the emergency department if symptoms worsen or become concerning.  SEEK MEDICAL CARE IF:  After the first few days, you feel you are getting worse rather than better.  You develop worsening shortness of breath, or brown or red sputum. These may be signs of pneumonia.  You develop yellow or brown nasal discharge or pain in the face, especially when you bend forward. These may be signs of sinusitis.  You develop a fever, swollen neck glands, pain with swallowing, or white areas in the back of your throat. These may be signs of strep throat.  Severe headache or stiff neck. Uncontrolled vomiting. Lightheadedness, feeling faint, or if you pass out. Problems breathing or shortness of breath. Weakness or inability to walk. Any other concerning symptoms or concerns. If not improving over 7 days or if feeling worse at any time.  RESOURCE GUIDE  Dental Problems  Patients with Medicaid: [facility] [phone] [address]                                                                   [phone] [address] Phone:  [phone]                                                                             Phone:  [phone]  If unable to pay or uninsured, contact:  [facility] or [facility]. to become qualified for the adult dental clinic.  Chronic Pain Problems Contact [facility]  [phone] Patients need to be referred by their primary care doctor.  Insufficient Money for Medicine Contact [employer] Way:  call "211" or [facility] [phone].  No Primary Care Doctor Call [facility]  [phone] Other agencies that provide inexpensive medical care    [facility]  [phone]    [facility]  [phone]    [facility]  [phone]    [facility]  [phone]    [facility]  [phone]    [facility]  [phone]  Psychological  Services [facility]  [phone] [facility]  [phone] [facility] Mental Health   [phone] (emergency services [phone])  Abuse/Neglect [facility] Child Abuse Hotline [phone] [facility] Child Abuse Hotline (431) 554-4471 (After Hours)  Emergency Shelter [facility] Ministries [phone]  M[facility] [phone] [facility] [phone]  MRSA Hotline #:   9787261560  [facility] Resources  [facility]     [employer]                          [facility] Dept. [address] [place]                        [address]          [address]  Blondell Reveal Phone:  [phone]                                     Phone:  [phone]                   Phone:  [phone]  [facility] Mental Health Phone:  [phone]  [facility] Child Abuse Hotline 801-723-4407 7872199481 (After Hours)   If you develop symptoms of Shortness of Breath, Chest Pain, Swelling of lips, mouth or tongue or if your condition becomes worse with any new symptoms, see your doctor or return to the Emergency Department for immediate care. Emergency services are not intended to be a substitute for comprehensive medical attention.  Please contact your doctor for follow up if not improving as expected.   Call  your doctor in 5-7 days or as directed if there is no improvement.   Community Resources: *IF YOU ARE IN IMMEDIATE DANGER CALL 911!  Abuse/Neglect:  Family Services Crisis Hotline [facility]): 929-040-4943 [facility] Against [facility]): 534-586-1471  After hours, holidays and weekends: (605)055-0990 National Domestic Violence Hotline: (732)467-7601  Mental Health: [facility] Mental Health: Drucie Ip: [phone]  Health  Clinics:  [facility]): [phone] Monday - Friday 8 AM - 9 PM, Saturday and Sunday 10 AM - 9 PM  [facility] [place]: [phone] Monday - Friday 8 AM - 5 PM  [facility]  [address]: [phone] Monday- Friday 8:30 AM - 5:30 PM, Sat 9 AM - 1 PM  24 HR [place] [facility] [address]: [phone] [facility] [address]: [phone] [facility] [address]: [phone]  24 HR [facility]: [address] [phone]  Cultures: If culture results are positive, we will notify you if a change in treatment is necessary.  LABORATORY TESTS:         If you had any labs drawn in the ED that have not resulted by the time you are discharged home, we will review these lab results and the treatment given to you.  If there is any further treatment or notification needed, we will contact you by phone, or letter.  "PLEASE ENSURE THAT YOU HAVE GIVEN US YOUR CURRENT WORKING PHONE NUMBER AND YOUR CURRENT ADDRESS, so that we can contact you if needed."  RADIOLOGY TESTS:  If the referred physician wants todays x-rays, please call the hospitals Radiology Department the day before your doctors appointment. [place]     [phone] [place]   [phone] [place]     [phone]  Our doctors and staff appreciate your choosing [place] for your emergency medical care needs. We are here to serve you.

## 2018-06-29 NOTE — ED Provider Notes (Signed)
[facility] EMERGENCY DEPARTMENT Provider Note   CSN: [id number] Arrival date & time: 06/29/18  1051    History   Chief Complaint Chief Complaint  Patient presents with  . Cough  . Fever  . Headache    HPI Karen Harmon is a 28 y.o. female with a PMH of Ovarian Cyst presenting with a productive cough, congestion, fever, mild frontal headache, chills, sore throat, and body aches onset yesterday. Patient reports fever was 101F yesterday. Patient states she did not get influenza vaccine. Patient reports sick contacts by her mom. Patient states she has taken ibuprofen with relief. Patient reports fatigue, urinary frequency, and one episode of vomiting yesterday. Patient denies abdominal pain, diarrhea, flank pain, or dysuria. Patient reports she has been eating and drinking at baseline. Patient reports headache is similar to headaches she has had in the past. Patient denies weakness, dizziness, vision changes, or neck pain. LMP 06/28/18.    HPI  Past Medical History:  Diagnosis Date  . Ovarian cyst     Patient Active Problem List   Diagnosis Date Noted  . Chronic daily headache 01/11/2017  . Snoring 01/11/2017  . Excessive daytime sleepiness 01/11/2017  . Pacemaker 01/11/2017    History reviewed. No pertinent surgical history.   OB History   No obstetric history on file.      Home Medications    Prior to Admission medications   Medication Sig Start Date End Date Taking? Authorizing Provider  aspirin-acetaminophen-caffeine (EXCEDRIN MIGRAINE) [phone] MG tablet Take by mouth every 6 (six) hours as needed for headache.    [provider]  benzonatate (TESSALON) 100 MG capsule Take 1 capsule (100 mg total) by mouth every 8 (eight) hours. 06/29/18   Carlyle Basques P, PA-C  docusate sodium (COLACE) 100 MG capsule Take 1 capsule (100 mg total) by mouth every 12 (twelve) hours. 02/16/17   Khatri, Hina, PA-C  etodolac (LODINE) 400 MG tablet Take 1 tablet (400  mg total) by mouth 2 (two) times daily. 01/11/17   Sater, Pearletha Furl, MD  ibuprofen (ADVIL,MOTRIN) 200 MG tablet Take 400 mg by mouth every 6 (six) hours as needed.    [provider]  metroNIDAZOLE (FLAGYL) 500 MG tablet Take 1 tablet (500 mg total) by mouth 2 (two) times daily. 06/29/18   Carlyle Basques P, PA-C  ondansetron (ZOFRAN ODT) 4 MG disintegrating tablet Take 1 tablet (4 mg total) by mouth every 8 (eight) hours as needed for nausea or vomiting. 02/16/17   Khatri, Hina, PA-C  polyethylene glycol (MIRALAX / GLYCOLAX) packet Take 17 g by mouth daily. 02/16/17   Dietrich Pates, PA-C    Family History History reviewed. No pertinent family history.  Social History Social History   Tobacco Use  . Smoking status: Never Smoker  . Smokeless tobacco: Never Used  Substance Use Topics  . Alcohol use: No  . Drug use: No     Allergies   Patient has no known allergies.   Review of Systems Review of Systems  Constitutional: Positive for chills, fatigue and fever. Negative for activity change and appetite change.  HENT: Positive for congestion, rhinorrhea and sore throat. Negative for ear pain and postnasal drip.   Eyes: Negative for pain, redness, itching and visual disturbance.  Respiratory: Negative for cough and shortness of breath.   Cardiovascular: Negative for chest pain.  Gastrointestinal: Positive for nausea and vomiting. Negative for abdominal pain and diarrhea.  Genitourinary: Positive for frequency. Negative for dysuria and flank pain.  Musculoskeletal: Positive for myalgias. Negative for gait problem.  Skin: Negative for rash.  Allergic/Immunologic: Negative for environmental allergies and immunocompromised state.  Neurological: Negative for dizziness, weakness and headaches.     Physical Exam Updated Vital Signs BP 110/73   Pulse 84   Temp 99.4 F (37.4 C) (Oral)   Resp 16   Ht 4\' 11"  (1.499 m)   Wt 72.6 kg   LMP 05/18/2018 (Approximate) Comment: period  just ended 06/28/18 that started 05/18/18  SpO2 100%   BMI 32.32 kg/m   Physical Exam Vitals signs and nursing note reviewed.  Constitutional:      General: She is not in acute distress.    Appearance: She is well-developed. She is not diaphoretic.  HENT:     Head: Normocephalic and atraumatic.     Right Ear: Tympanic membrane, ear canal and external ear normal. No middle ear effusion.     Left Ear: Tympanic membrane, ear canal and external ear normal.  No middle ear effusion.     Nose: Mucosal edema, congestion and rhinorrhea present.     Mouth/Throat:     Mouth: Mucous membranes are moist.     Pharynx: Uvula midline. Posterior oropharyngeal erythema present. No oropharyngeal exudate.  Eyes:     General:        Right eye: No discharge.        Left eye: No discharge.     Extraocular Movements: Extraocular movements intact.     Conjunctiva/sclera: Conjunctivae normal.     Pupils: Pupils are equal, round, and reactive to light.  Neck:     Musculoskeletal: Normal range of motion and neck supple.  Cardiovascular:     Rate and Rhythm: Normal rate and regular rhythm.     Heart sounds: Normal heart sounds. No murmur. No friction rub. No gallop.   Pulmonary:     Effort: Pulmonary effort is normal. No respiratory distress.     Breath sounds: Normal breath sounds. No wheezing or rales.  Abdominal:     General: There is no distension.     Palpations: Abdomen is soft.     Tenderness: There is no abdominal tenderness. There is no right CVA tenderness, left CVA tenderness or guarding.  Musculoskeletal: Normal range of motion.  Lymphadenopathy:     Cervical: No cervical adenopathy.  Skin:    General: Skin is warm.     Findings: No rash.  Neurological:     Mental Status: She is alert and oriented to person, place, and time.    Mental Status:  Alert, oriented, thought content appropriate, able to give a coherent history. Speech fluent without evidence of aphasia. Able to follow 2 step  commands without difficulty.  Cranial Nerves:  II:  Peripheral visual fields grossly normal, pupils equal, round, reactive to light III,IV, VI: ptosis not present, extra-ocular motions intact bilaterally  V,VII: smile symmetric, facial light touch sensation equal VIII: hearing grossly normal to voice  X: uvula elevates symmetrically  XI: bilateral shoulder shrug symmetric and strong XII: midline tongue extension without fassiculations Motor:  Normal tone. 5/5 in upper and lower extremities bilaterally including strong and equal grip strength and dorsiflexion/plantar flexion Sensory: Pinprick and light touch normal in all extremities.  Deep Tendon Reflexes: 2+ and symmetric in the biceps and patella Cerebellar: normal finger-to-nose with bilateral upper extremities Gait: normal gait and balance.  CV: distal pulses palpable throughout    ED Treatments / Results  Labs (all labs ordered are listed, but only abnormal  results are displayed) Labs Reviewed  URINALYSIS, ROUTINE W REFLEX MICROSCOPIC - Abnormal; Notable for the following components:      Result Value   Hgb urine dipstick MODERATE (*)    Ketones, ur 15 (*)    Leukocytes,Ua TRACE (*)    All other components within normal limits  URINALYSIS, MICROSCOPIC (REFLEX) - Abnormal; Notable for the following components:   Bacteria, UA FEW (*)    Trichomonas, UA PRESENT (*)    All other components within normal limits  URINE CULTURE  PREGNANCY, URINE    EKG None  Radiology Dg Chest 2 View  Result Date: 06/29/2018 CLINICAL DATA:  Pt here with cough, headache and fever that started yesterday EXAM: CHEST - 2 VIEW COMPARISON:  06/27/2017 FINDINGS: Lungs are clear. Heart size and mediastinal contours are within normal limits. No effusion. Visualized bones unremarkable. IMPRESSION: No acute cardiopulmonary disease. Electronically Signed   By: Corlis Leak M.D.   On: 06/29/2018 12:18    Procedures Procedures (including critical care  time)  Medications Ordered in ED Medications  acetaminophen (TYLENOL) tablet 650 mg (650 mg Oral Given 06/29/18 1205)     Initial Impression / Assessment and Plan / ED Course  I have reviewed the triage vital signs and the nursing notes.  Pertinent labs & imaging results that were available during my care of the patient were reviewed by me and considered in my medical decision making (see chart for details).  Clinical Course as of Jun 30 1331  Wed Jun 29, 2018  1227 No acute cardiopulmonary disease.  DG Chest 2 View [AH]  1302 Trichomonas present on UA. Discussed findings with patient and offered additional STD testing. Patient refused.   Trichomonas, UA(!): PRESENT [AH]    Clinical Course User Index [AH] Ardyth Harps, Kerrigan Gombos P, PA-C      Pt CXR negative for acute infiltrate. Patients symptoms are consistent with a flu-like illness. Discussed that antibiotics are not indicated for viral infections. Discussed risks and benefits of Tamiflu. Patient does not want Tamiflu at this time. Pt will be discharged with symptomatic treatment.  UA reveals trichomonas. Will treat with metronidazole. Discussed testing and treating other STIs, but patient refused at this time. Verbalizes understanding and is agreeable with plan. Pt is hemodynamically stable & in NAD prior to dc.  Final Clinical Impressions(s) / ED Diagnoses   Final diagnoses:  Influenza-like illness  Trichomonas infection    ED Discharge Orders         Ordered    metroNIDAZOLE (FLAGYL) 500 MG tablet  2 times daily     06/29/18 1323    benzonatate (TESSALON) 100 MG capsule  Every 8 hours     06/29/18 1323           Carlyle Basques [place], [place] 06/29/18 1333    Tilden Fossa, MD 06/30/18 0700

## 2018-06-29 NOTE — ED Triage Notes (Signed)
Pt here with cough, headache and fever that responds to medication since yesterday.

## 2018-06-29 NOTE — ED Notes (Signed)
C/o ha, cough. fever Congestion chest soreness x 2 days  States fever was 104 yesterday  Last IBU at 1000 this am

## 2018-06-30 LAB — URINE CULTURE

## 2019-02-14 ENCOUNTER — Emergency Department (HOSPITAL_BASED_OUTPATIENT_CLINIC_OR_DEPARTMENT_OTHER): Payer: Self-pay

## 2019-02-14 ENCOUNTER — Encounter (HOSPITAL_BASED_OUTPATIENT_CLINIC_OR_DEPARTMENT_OTHER): Payer: Self-pay | Admitting: Emergency Medicine

## 2019-02-14 ENCOUNTER — Other Ambulatory Visit: Payer: Self-pay

## 2019-02-14 ENCOUNTER — Emergency Department (HOSPITAL_BASED_OUTPATIENT_CLINIC_OR_DEPARTMENT_OTHER)
Admission: EM | Admit: 2019-02-14 | Discharge: 2019-02-14 | Disposition: A | Discharge status: Home / Self Care | Payer: Self-pay | Attending: Emergency Medicine | Admitting: Emergency Medicine

## 2019-02-14 DIAGNOSIS — N946 Dysmenorrhea, unspecified: Secondary | ICD-10-CM | POA: Insufficient documentation

## 2019-02-14 DIAGNOSIS — Z79899 Other long term (current) drug therapy: Secondary | ICD-10-CM | POA: Insufficient documentation

## 2019-02-14 DIAGNOSIS — R102 Pelvic and perineal pain: Secondary | ICD-10-CM

## 2019-02-14 DIAGNOSIS — Z950 Presence of cardiac pacemaker: Secondary | ICD-10-CM | POA: Insufficient documentation

## 2019-02-14 DIAGNOSIS — A599 Trichomoniasis, unspecified: Secondary | ICD-10-CM | POA: Insufficient documentation

## 2019-02-14 DIAGNOSIS — R1032 Left lower quadrant pain: Secondary | ICD-10-CM

## 2019-02-14 HISTORY — DX: Headache, unspecified: R51.9

## 2019-02-14 LAB — WET PREP, GENITAL
Sperm: NONE SEEN
Yeast Wet Prep HPF POC: NONE SEEN

## 2019-02-14 LAB — PREGNANCY, URINE: Preg Test, Ur: NEGATIVE

## 2019-02-14 MED ORDER — METRONIDAZOLE 500 MG PO TABS: 500.0000 mg | ORAL_TABLET | Freq: Two times a day (BID) | ORAL | 0 refills | Status: DC

## 2019-02-14 MED ORDER — AZITHROMYCIN 250 MG PO TABS
1000.0000 mg | ORAL_TABLET | Freq: Once | ORAL | Status: AC
  Administered 2019-02-14: 1000 mg via ORAL
  Filled 2019-02-14: qty 4

## 2019-02-14 MED ORDER — KETOROLAC TROMETHAMINE 60 MG/2ML IM SOLN
60.0000 mg | Freq: Once | INTRAMUSCULAR | Status: AC
  Administered 2019-02-14: 60 mg via INTRAMUSCULAR
  Filled 2019-02-14: qty 2

## 2019-02-14 MED ORDER — CEFTRIAXONE SODIUM 250 MG IJ SOLR
250.0000 mg | Freq: Once | INTRAMUSCULAR | Status: AC
Start: 2019-02-14 — End: 2019-02-14
  Administered 2019-02-14: 250 mg via INTRAMUSCULAR
  Filled 2019-02-14: qty 250

## 2019-02-14 MED FILL — METRONIDAZOLE 500 MG TABS: 500 | 7 days supply | Qty: 14 | Fill #0

## 2019-02-14 NOTE — ED Notes (Signed)
ED Provider at bedside. 

## 2019-02-14 NOTE — ED Triage Notes (Signed)
Left and central abdominal pain.  No dysuria, no vaginal discharge, no fever.  Pt admits to heavy periods and ovarian cyst.  Last menstral cycle 01/24/19

## 2019-02-14 NOTE — ED Notes (Signed)
Pt denies emesis, reports nausea

## 2019-02-14 NOTE — ED Provider Notes (Signed)
[facility] EMERGENCY DEPARTMENT Provider Note   CSN: [id number] Arrival date & time: 02/14/19  [id number]     History   Chief Complaint Chief Complaint  Patient presents with  . Abdominal Pain    HPI Karen Harmon is a 28 y.o. female.     The history is provided by the patient.  Abdominal Pain Pain location:  LLQ Pain quality: aching, cramping and sharp   Pain radiates to:  Does not radiate Pain severity:  Moderate Onset quality:  Gradual Duration:  1 week Timing:  Intermittent Progression:  Waxing and waning Chronicity:  Recurrent Context comment:  Patient states that she has recurrent very painful menses with bad headaches, nausea, vomiting and pain that even aches down into her legs with a heavy flow Relieved by: Better with rest and lying down. Worsened by:  Movement and palpation Ineffective treatments:  OTC medications Associated symptoms: vaginal discharge   Associated symptoms: no anorexia, no constipation, no cough, no diarrhea, no dysuria, no fever, no nausea and no vaginal bleeding   Associated symptoms comment:  Chronic vaginal discharge that is sometimes thin and watery and other times it is thicker.  Last time patient was sexually active was 1 year ago.  Last menses was 2 weeks ago.  Approximately 2 to 3 years ago she does have a history of having AN ovarian cyst. Risk factors: not pregnant     Past Medical History:  Diagnosis Date  . Frequent headaches   . Ovarian cyst     Patient Active Problem List   Diagnosis Date Noted  . Chronic daily headache 01/11/2017  . Snoring 01/11/2017  . Excessive daytime sleepiness 01/11/2017  . Pacemaker 01/11/2017    No past surgical history on file.   OB History   No obstetric history on file.      Home Medications    Prior to Admission medications   Medication Sig Start Date End Date Taking? Authorizing Provider  aspirin-acetaminophen-caffeine (EXCEDRIN MIGRAINE) [phone] MG tablet Take by  mouth every 6 (six) hours as needed for headache.    [provider]  benzonatate (TESSALON) 100 MG capsule Take 1 capsule (100 mg total) by mouth every 8 (eight) hours. 06/29/18   Carlyle BasquesHernandez, Ana P, PA-C  docusate sodium (COLACE) 100 MG capsule Take 1 capsule (100 mg total) by mouth every 12 (twelve) hours. 02/16/17   Khatri, Hina, PA-C  etodolac (LODINE) 400 MG tablet Take 1 tablet (400 mg total) by mouth 2 (two) times daily. 01/11/17   Sater, Pearletha Furlichard A, MD  ibuprofen (ADVIL,MOTRIN) 200 MG tablet Take 400 mg by mouth every 6 (six) hours as needed.    [provider]  metroNIDAZOLE (FLAGYL) 500 MG tablet Take 1 tablet (500 mg total) by mouth 2 (two) times daily. 06/29/18   Carlyle BasquesHernandez, Ana P, PA-C  ondansetron (ZOFRAN ODT) 4 MG disintegrating tablet Take 1 tablet (4 mg total) by mouth every 8 (eight) hours as needed for nausea or vomiting. 02/16/17   Khatri, Hina, PA-C  polyethylene glycol (MIRALAX / GLYCOLAX) packet Take 17 g by mouth daily. 02/16/17   Dietrich PatesKhatri, Hina, PA-C    Family History No family history on file.  Social History Social History   Tobacco Use  . Smoking status: Never Smoker  . Smokeless tobacco: Never Used  Substance Use Topics  . Alcohol use: No  . Drug use: No     Allergies   Patient has no known allergies.   Review of Systems Review of Systems  Constitutional: Negative for fever.  Respiratory: Negative for cough.   Gastrointestinal: Positive for abdominal pain. Negative for anorexia, constipation, diarrhea and nausea.  Genitourinary: Positive for vaginal discharge. Negative for dysuria and vaginal bleeding.  All other systems reviewed and are negative.    Physical Exam Updated Vital Signs BP 117/69   Pulse 96   Temp 98.2 F (36.8 C) (Oral)   Resp 16   Ht 5\' 1"  (1.549 m)   Wt 69.5 kg   LMP 01/24/2019   SpO2 97%   BMI 28.97 kg/m   Physical Exam Vitals signs and nursing note reviewed.  Constitutional:      General: She is not in  acute distress.    Appearance: She is well-developed and normal weight.  HENT:     Head: Normocephalic and atraumatic.  Eyes:     Pupils: Pupils are equal, round, and reactive to light.  Cardiovascular:     Rate and Rhythm: Normal rate and regular rhythm.     Heart sounds: Normal heart sounds. No murmur. No friction rub.  Pulmonary:     Effort: Pulmonary effort is normal.     Breath sounds: Normal breath sounds. No wheezing or rales.  Abdominal:     General: Bowel sounds are normal. There is no distension.     Palpations: Abdomen is soft.     Tenderness: There is abdominal tenderness in the left lower quadrant. There is no guarding or rebound.  Genitourinary:    Vagina: Vaginal discharge present.     Cervix: No cervical motion tenderness or friability.     Uterus: Normal.      Adnexa: Right adnexa normal.       Right: No mass, tenderness or fullness.         Left: Tenderness and fullness present.   Musculoskeletal: Normal range of motion.        General: No tenderness.     Comments: No edema  Skin:    General: Skin is warm and dry.     Findings: No rash.  Neurological:     Mental Status: She is alert and oriented to person, place, and time.     Cranial Nerves: No cranial nerve deficit.  Psychiatric:        Behavior: Behavior normal.      ED Treatments / Results  Labs (all labs ordered are listed, but only abnormal results are displayed) Labs Reviewed  WET PREP, GENITAL - Abnormal; Notable for the following components:      Result Value   Trich, Wet Prep PRESENT (*)    Clue Cells Wet Prep HPF POC PRESENT (*)    WBC, Wet Prep HPF POC MANY (*)    All other components within normal limits  PREGNANCY, URINE  GC/CHLAMYDIA PROBE AMP ([place]) NOT AT [facility]    EKG None  Radiology US Pelvic Complete W Transvaginal And Torsion R/o  Result Date: 02/14/2019 CLINICAL DATA:  Left lower quadrant pain, history of ovarian cyst EXAM: TRANSABDOMINAL AND TRANSVAGINAL  ULTRASOUND OF PELVIS DOPPLER ULTRASOUND OF OVARIES TECHNIQUE: Both transabdominal and transvaginal ultrasound examinations of the pelvis were performed. Transabdominal technique was performed for global imaging of the pelvis including uterus, ovaries, adnexal regions, and pelvic cul-de-sac. It was necessary to proceed with endovaginal exam following the transabdominal exam to visualize the ovaries. Color and duplex Doppler ultrasound was utilized to evaluate blood flow to the ovaries. COMPARISON:  08/04/2016 FINDINGS: Uterus Measurements: 6.0 x 2.6 x 4.2 cm = volume: 34.4 mL.  No fibroids or other mass visualized. Endometrium Thickness: 8 mm.  No focal abnormality visualized. Right ovary Measurements: 3.2 x 2.0 x 2.9 cm = volume: 9.8 mL. Multiple small follicles. Left ovary Measurements: 2.8 x 1.4 x 3.2 cm = volume: 6.7 mL. Multiple small follicles. Pulsed Doppler evaluation of both ovaries demonstrates normal low-resistance arterial and venous waveforms. Other findings Trace free fluid in the low pelvis. IMPRESSION: 1.  No ultrasound abnormality of the pelvis to explain pain. 2. Multiple small bilateral ovarian follicles. Arterial and venous Doppler flow is present bilaterally. 3. Trace, nonspecific free fluid in the low pelvis, likely functional in the reproductive age setting. Electronically Signed   By: Lauralyn Primes M.D.   On: 02/14/2019 13:08    Procedures Procedures (including critical care time)  Medications Ordered in ED Medications  ketorolac (TORADOL) injection 60 mg (60 mg Intramuscular Given 02/14/19 0853)     Initial Impression / Assessment and Plan / ED Course  I have reviewed the triage vital signs and the nursing notes.  Pertinent labs & imaging results that were available during my care of the patient were reviewed by me and considered in my medical decision making (see chart for details).       Patient is a 28 year old female presenting today with worsening left pelvic pain.   Patient has vertically severe symptoms around the time of her menses with intense pain, nausea, vomiting, headaches and poor appetite.  This is been worsening as she is progressed into her 6s.  She does have a prior history of ovarian cyst.  Last sexually active a year ago and low suspicion at this time for STI, TOA or ectopic pregnancy.  Symptoms are not classic for ovarian torsion.  Patient does have significant tenderness and fullness in the left adnexa concerning for ovarian cyst.  She has a clear discharge could be bacterial vaginosis.  UA and UPT negative.  Patient given Toradol and pelvic ultrasound pending  9:54 AM UPT neg but wet prep positive for trichomonas which is most likely the cause of her discharge.  Ultrasound shows no acute abnormality and multiple small bilateral ovarian follicles with normal flow bilaterally.  There is no evidence of TOA or other acute process.  Patient's pain may be related to ovulation in the setting of having dysmenorrhea.  Patient was treated with Rocephin and azithromycin to cover for gonorrhea and chlamydia but was also given a prescription for Flagyl for her trichomonas.  She was encouraged to follow-up with an OB/GYN and to use NSAIDs as needed for the pain.  Final Clinical Impressions(s) / ED Diagnoses   Final diagnoses:  Dysmenorrhea  Trichomonas infection    ED Discharge Orders         Ordered    metroNIDAZOLE (FLAGYL) 500 MG tablet  2 times daily     02/14/19 1319           Gwyneth Sprout, MD 02/14/19 1346

## 2019-02-14 NOTE — ED Notes (Signed)
Patient transported to Ultrasound 

## 2019-02-14 NOTE — ED Notes (Signed)
Pt aware we need urine specimen, unable to provide sample at this time 

## 2019-02-14 NOTE — ED Notes (Signed)
pelvic cart at bedside 

## 2019-02-14 NOTE — Discharge Instructions (Signed)
For the next 1 week while you are on this antibiotic avoid alcohol or else he will become extremely ill.  Avoid any sexual contact until symptoms resolve.  You can take Tylenol and ibuprofen as needed for the pain.

## 2019-02-15 LAB — GC/CHLAMYDIA PROBE AMP (~~LOC~~) NOT AT ARMC
Chlamydia: POSITIVE — AB
Neisseria Gonorrhea: NEGATIVE

## 2020-09-10 ENCOUNTER — Other Ambulatory Visit: Payer: Self-pay

## 2020-09-10 ENCOUNTER — Emergency Department: Payer: Self-pay

## 2020-09-10 ENCOUNTER — Emergency Department
Admission: EM | Admit: 2020-09-10 | Discharge: 2020-09-10 | Disposition: A | Discharge status: Home / Self Care | Payer: Self-pay | Attending: Emergency Medicine | Admitting: Emergency Medicine

## 2020-09-10 DIAGNOSIS — U071 COVID-19: Secondary | ICD-10-CM | POA: Insufficient documentation

## 2020-09-10 DIAGNOSIS — Z950 Presence of cardiac pacemaker: Secondary | ICD-10-CM | POA: Insufficient documentation

## 2020-09-10 LAB — CBC
HCT: 41 % (ref 36.0–46.0)
Hemoglobin: 13.8 g/dL (ref 12.0–15.0)
MCH: 30.3 pg (ref 26.0–34.0)
MCHC: 33.7 g/dL (ref 30.0–36.0)
MCV: 90.1 fL (ref 80.0–100.0)
Platelets: 229 10*3/uL (ref 150–400)
RBC: 4.55 MIL/uL (ref 3.87–5.11)
RDW: 12.1 % (ref 11.5–15.5)
WBC: 6.9 10*3/uL (ref 4.0–10.5)
nRBC: 0 % (ref 0.0–0.2)

## 2020-09-10 LAB — COMPREHENSIVE METABOLIC PANEL
ALT: 13 U/L (ref 0–44)
AST: 21 U/L (ref 15–41)
Albumin: 3.6 g/dL (ref 3.5–5.0)
Alkaline Phosphatase: 34 U/L — ABNORMAL LOW (ref 38–126)
Anion gap: 8 (ref 5–15)
BUN: 7 mg/dL (ref 6–20)
CO2: 24 mmol/L (ref 22–32)
Calcium: 8.5 mg/dL — ABNORMAL LOW (ref 8.9–10.3)
Chloride: 104 mmol/L (ref 98–111)
Creatinine, Ser: 0.91 mg/dL (ref 0.44–1.00)
GFR, Estimated: >60 mL/min (ref >60)
Glucose, Bld: 79 mg/dL (ref 70–99)
Potassium: 3.4 mmol/L — ABNORMAL LOW (ref 3.5–5.1)
Sodium: 136 mmol/L (ref 135–145)
Total Bilirubin: 0.8 mg/dL (ref 0.3–1.2)
Total Protein: 6 g/dL — ABNORMAL LOW (ref 6.5–8.1)

## 2020-09-10 LAB — RESP PANEL BY RT-PCR (FLU A&B, COVID) ARPGX2
Influenza A by PCR: NEGATIVE
Influenza B by PCR: NEGATIVE
SARS Coronavirus 2 by RT PCR: POSITIVE — AB

## 2020-09-10 LAB — TROPONIN I (HIGH SENSITIVITY)
Troponin I (High Sensitivity): 2 ng/L (ref <18)
Troponin I (High Sensitivity): <2 ng/L (ref <18)

## 2020-09-10 LAB — POC URINE PREG, ED: Preg Test, Ur: NEGATIVE

## 2020-09-10 LAB — D-DIMER, QUANTITATIVE: D-Dimer, Quant: <0.27 ug/mL-FEU (ref 0.00–0.50)

## 2020-09-10 MED ORDER — SODIUM CHLORIDE 0.9 % IV BOLUS
1000.0000 mL | Freq: Once | INTRAVENOUS | Status: AC
  Administered 2020-09-10: 1000 mL via INTRAVENOUS

## 2020-09-10 MED ORDER — LORAZEPAM 2 MG/ML IJ SOLN
0.5000 mg | Freq: Once | INTRAMUSCULAR | Status: AC
  Administered 2020-09-10: 0.5 mg via INTRAVENOUS
  Filled 2020-09-10: qty 1

## 2020-09-10 MED ORDER — ACETAMINOPHEN 500 MG PO TABS
1000.0000 mg | ORAL_TABLET | Freq: Once | ORAL | Status: AC
  Administered 2020-09-10: 1000 mg via ORAL
  Filled 2020-09-10: qty 2

## 2020-09-10 NOTE — Discharge Instructions (Signed)
As we discussed please use Tylenol or ibuprofen every 6 hours as needed for fever or discomfort.  Please drink plenty of fluids and obtain plenty of rest.  Please remain in quarantine for the next 5 days.  As long as you are fever free on the fifth day you may return to work on the sixth day but you must wear a facemask for the next 2 weeks.  Return to the emergency department for any worsening chest pain, shortness of breath, or any other symptom personally concerning to yourself

## 2020-09-10 NOTE — ED Triage Notes (Addendum)
Pt arrives via pov with c/o CP, SOB, dizziness and headache. Denies blurred vision, or n/v/d. Pt tearful and and seems anxious. Pt RR in 30's. RN encouraged pt to practice breathing techniques to slow rate.

## 2020-09-10 NOTE — ED Provider Notes (Signed)
[facility] Emergency Department Provider Note  Time seen: 7:21 PM  I have reviewed the triage vital signs and the nursing notes.   HISTORY  Chief Complaint Chest Pain, Fever, and Shortness of Breath   HPI Karen Harmon is a 30 y.o. female presents to the emergency department for fever chest pain or shortness of breath.  According to the patient she felt well this morning and then around 12 PM today began feeling somewhat short of breath with central chest pain.  Patient states this afternoon she developed a fever.  Upon arrival patient found to be febrile to one 1.7 patient is quite tachypneic but is also tearful and very anxious and appearing.  Patient states shortness of breath fever slight cough and a headache all starting this afternoon.  No known sick contacts.  No dysuria vaginal discharge or bleeding.  No abdominal pain.  Past Medical History:  Diagnosis Date  . Frequent headaches   . Ovarian cyst     Patient Active Problem List   Diagnosis Date Noted  . Chronic daily headache 01/11/2017  . Snoring 01/11/2017  . Excessive daytime sleepiness 01/11/2017  . Pacemaker 01/11/2017    History reviewed. No pertinent surgical history.  Prior to Admission medications   Medication Sig Start Date End Date Taking? Authorizing Provider  aspirin-acetaminophen-caffeine (EXCEDRIN MIGRAINE) [phone] MG tablet Take by mouth every 6 (six) hours as needed for headache.    [provider]  benzonatate (TESSALON) 100 MG capsule Take 1 capsule (100 mg total) by mouth every 8 (eight) hours. 06/29/18   Carlyle Basques P, PA-C  docusate sodium (COLACE) 100 MG capsule Take 1 capsule (100 mg total) by mouth every 12 (twelve) hours. 02/16/17   Khatri, Hina, PA-C  etodolac (LODINE) 400 MG tablet Take 1 tablet (400 mg total) by mouth 2 (two) times daily. 01/11/17   Sater, Pearletha Furl, MD  ibuprofen (ADVIL,MOTRIN) 200 MG tablet Take 400 mg by mouth every 6 (six) hours as  needed.    [provider]  metroNIDAZOLE (FLAGYL) 500 MG tablet Take 1 tablet (500 mg total) by mouth 2 (two) times daily. 02/14/19   Gwyneth Sprout, MD  ondansetron (ZOFRAN ODT) 4 MG disintegrating tablet Take 1 tablet (4 mg total) by mouth every 8 (eight) hours as needed for nausea or vomiting. 02/16/17   Khatri, Hina, PA-C  polyethylene glycol (MIRALAX / GLYCOLAX) packet Take 17 g by mouth daily. 02/16/17   Khatri, Hina, PA-C    No Known Allergies  History reviewed. No pertinent family history.  Social History Social History   Tobacco Use  . Smoking status: Never Smoker  . Smokeless tobacco: Never Used  Vaping Use  . Vaping Use: Never used  Substance Use Topics  . Alcohol use: No  . Drug use: No    Review of Systems Constitutional: Positive fever Cardiovascular: Positive for chest pain Respiratory: Positive shortness of breath.  Mild cough. Gastrointestinal: Negative for abdominal pain Genitourinary: Negative for urinary compaints Musculoskeletal: Negative for musculoskeletal complaints Skin: Negative for skin complaints  Neurological: Moderate headache All other ROS negative  ____________________________________________   PHYSICAL EXAM:  VITAL SIGNS: ED Triage Vitals  Enc Vitals Group     BP 09/10/20 1900 (!) 120/93     Pulse Rate 09/10/20 1900 (!) 116     Resp 09/10/20 1900 (!) 30     Temp 09/10/20 1900 (!) 101.7 F (38.7 C)     Temp Source 09/10/20 1900 Oral  SpO2 09/10/20 1900 100 %     Weight 09/10/20 1901 140 lb (63.5 kg)     Height 09/10/20 1901 5' (1.524 m)     Head Circumference --      Peak Flow --      Pain Score 09/10/20 1901 9     Pain Loc --      Pain Edu? --      Excl. in GC? --    Constitutional: Alert and oriented.  Mild distress very anxious appearing, tearful. Eyes: Normal exam ENT      Head: Normocephalic and atraumatic.      Nose: No congestion/rhinnorhea.      Mouth/Throat: Mucous membranes are  moist. Cardiovascular: Regular rhythm rate around 120 bpm.  No obvious murmur. Respiratory: Moderate tachypnea.  Breath sounds are clear without wheeze rales or rhonchi Gastrointestinal: Soft and nontender. No distention. Musculoskeletal: Nontender with normal range of motion in all extremities. No lower extremity tenderness or edema. Neurologic:  Normal speech and language. No gross focal neurologic deficits  Skin:  Skin is warm, dry and intact.  Psychiatric: Mood and affect are normal.   ____________________________________________    EKG  EKG viewed and interpreted by myself shows sinus tachycardia 119 bpm with a narrow QRS, normal axis, normal intervals, nonspecific ST changes.  ____________________________________________    RADIOLOGY  Chest x-ray is clear  ____________________________________________   INITIAL IMPRESSION / ASSESSMENT AND PLAN / ED COURSE  Pertinent labs & imaging results that were available during my care of the patient were reviewed by me and considered in my medical decision making (see chart for details).   Patient presents to the emergency department for chest pain shortness of breath found to be febrile as well as a headache.  Differential would include influenza, COVID, pneumonia, possibly PE, ACS.  We will check labs including cardiac enzymes and a D-dimer.  We will obtain a chest x-ray and a COVID swab as well as urinalysis.  Patient agreeable to plan of care.  We will IV hydrate and treat with a small dose of Ativan given the patient's significant anxiety while awaiting results.  Lab work is overall reassuring including negative D-dimer and negative troponin.  Patient's COVID test is positive.  Chest x-ray is clear.  Overall the patient appears well.  I discussed supportive care at home including Tylenol and ibuprofen as needed for fever/discomfort as well as plenty of fluids and rest.  Patient agreeable to plan of care.  Karen Harmon was evaluated in  Emergency Department on 09/10/2020 for the symptoms described in the history of present illness. She was evaluated in the context of the global COVID-19 pandemic, which necessitated consideration that the patient might be at risk for infection with the SARS-CoV-2 virus that causes COVID-19. Institutional protocols and algorithms that pertain to the evaluation of patients at risk for COVID-19 are in a state of rapid change based on information released by regulatory bodies including the CDC and federal and state organizations. These policies and algorithms were followed during the patient's care in the ED.  ____________________________________________   FINAL CLINICAL IMPRESSION(S) / ED DIAGNOSES  Fever COVID-19   Minna Antis, MD 09/10/20 2241

## 2020-10-01 ENCOUNTER — Other Ambulatory Visit: Payer: Self-pay

## 2020-10-01 ENCOUNTER — Encounter: Payer: Self-pay | Admitting: Emergency Medicine

## 2020-10-01 ENCOUNTER — Emergency Department
Admission: EM | Admit: 2020-10-01 | Discharge: 2020-10-01 | Disposition: A | Discharge status: Home / Self Care | Payer: Self-pay | Attending: Emergency Medicine | Admitting: Emergency Medicine

## 2020-10-01 DIAGNOSIS — Z7982 Long term (current) use of aspirin: Secondary | ICD-10-CM | POA: Insufficient documentation

## 2020-10-01 DIAGNOSIS — R109 Unspecified abdominal pain: Secondary | ICD-10-CM | POA: Insufficient documentation

## 2020-10-01 DIAGNOSIS — Z950 Presence of cardiac pacemaker: Secondary | ICD-10-CM | POA: Insufficient documentation

## 2020-10-01 DIAGNOSIS — B3731 Acute candidiasis of vulva and vagina: Secondary | ICD-10-CM

## 2020-10-01 DIAGNOSIS — B373 Candidiasis of vulva and vagina: Secondary | ICD-10-CM | POA: Insufficient documentation

## 2020-10-01 LAB — URINALYSIS, COMPLETE (UACMP) WITH MICROSCOPIC
Bilirubin Urine: NEGATIVE
Glucose, UA: NEGATIVE mg/dL
Hgb urine dipstick: NEGATIVE
Ketones, ur: NEGATIVE mg/dL
Leukocytes,Ua: NEGATIVE
Nitrite: NEGATIVE
Protein, ur: NEGATIVE mg/dL
Specific Gravity, Urine: 1.018 (ref 1.005–1.030)
pH: 6 (ref 5.0–8.0)

## 2020-10-01 LAB — WET PREP, GENITAL
Clue Cells Wet Prep HPF POC: NONE SEEN
Sperm: NONE SEEN
Trich, Wet Prep: NONE SEEN

## 2020-10-01 LAB — POC URINE PREG, ED: Preg Test, Ur: NEGATIVE

## 2020-10-01 LAB — CHLAMYDIA/NGC RT PCR (ARMC ONLY)
Chlamydia Tr: NOT DETECTED
N gonorrhoeae: NOT DETECTED

## 2020-10-01 MED ORDER — AZITHROMYCIN 500 MG PO TABS
1000.0000 mg | ORAL_TABLET | Freq: Once | ORAL | Status: AC
  Administered 2020-10-01: 1000 mg via ORAL
  Filled 2020-10-01: qty 2

## 2020-10-01 MED ORDER — CEFTRIAXONE SODIUM 1 G IJ SOLR
500.0000 mg | Freq: Once | INTRAMUSCULAR | Status: AC
  Administered 2020-10-01: 500 mg via INTRAMUSCULAR
  Filled 2020-10-01: qty 10

## 2020-10-01 MED ORDER — FLUCONAZOLE 50 MG PO TABS
150.0000 mg | ORAL_TABLET | Freq: Once | ORAL | Status: AC
  Administered 2020-10-01: 150 mg via ORAL
  Filled 2020-10-01: qty 1

## 2020-10-01 NOTE — ED Triage Notes (Signed)
C/O lower abdominal pain x 1 week.  Denies N/V/D.  AAOx3.  Skin warm and dry. NAD

## 2020-10-01 NOTE — ED Provider Notes (Signed)
[facility] Emergency Department Provider Note  ____________________________________________  Time seen: Approximately 9:19 AM  I have reviewed the triage vital signs and the nursing notes.   HISTORY  Chief Complaint Abdominal Pain    HPI Karen Harmon is a 30 y.o. female with a history of ovarian cysts who comes ED complaining of pelvic pain for the past week.  No nausea vomiting diarrhea fevers or chills.  Nonradiating, no aggravating or alleviating factors.  LMP 7 weeks ago.   She does note a thick vaginal discharge  No breast enlargement or tenderness.  No abdominal enlargement but feels bloated.  No sick contacts.  Symptoms are waxing and waning, gradual onset and ongoing for the past week.   Past Medical History:  Diagnosis Date  . Frequent headaches   . Ovarian cyst      Patient Active Problem List   Diagnosis Date Noted  . Chronic daily headache 01/11/2017  . Snoring 01/11/2017  . Excessive daytime sleepiness 01/11/2017  . Pacemaker 01/11/2017     History reviewed. No pertinent surgical history.   Prior to Admission medications   Medication Sig Start Date End Date Taking? Authorizing Provider  aspirin-acetaminophen-caffeine (EXCEDRIN MIGRAINE) [id number] MG tablet Take by mouth every 6 (six) hours as needed for headache.    [provider]  benzonatate (TESSALON) 100 MG capsule Take 1 capsule (100 mg total) by mouth every 8 (eight) hours. 06/29/18   Carlyle Basques P, PA-C  docusate sodium (COLACE) 100 MG capsule Take 1 capsule (100 mg total) by mouth every 12 (twelve) hours. 02/16/17   Khatri, Hina, PA-C  etodolac (LODINE) 400 MG tablet Take 1 tablet (400 mg total) by mouth 2 (two) times daily. 01/11/17   Sater, Pearletha Furl, MD  ibuprofen (ADVIL,MOTRIN) 200 MG tablet Take 400 mg by mouth every 6 (six) hours as needed.    [provider]  metroNIDAZOLE (FLAGYL) 500 MG tablet Take 1 tablet (500 mg total) by mouth 2 (two) times  daily. 02/14/19   Gwyneth Sprout, MD  ondansetron (ZOFRAN ODT) 4 MG disintegrating tablet Take 1 tablet (4 mg total) by mouth every 8 (eight) hours as needed for nausea or vomiting. 02/16/17   Khatri, Hina, PA-C  polyethylene glycol (MIRALAX / GLYCOLAX) packet Take 17 g by mouth daily. 02/16/17   Dietrich Pates, PA-C     Allergies Patient has no known allergies.   No family history on file.  Social History Social History   Tobacco Use  . Smoking status: Never Smoker  . Smokeless tobacco: Never Used  Vaping Use  . Vaping Use: Never used  Substance Use Topics  . Alcohol use: No  . Drug use: No    Review of Systems  Constitutional:   No fever or chills.  ENT:   No sore throat. No rhinorrhea. Cardiovascular:   No chest pain or syncope. Respiratory:   No dyspnea or cough. Gastrointestinal:   Positive for low abdominal pain without vomiting or diarrhea Musculoskeletal:   Negative for focal pain or swelling All other systems reviewed and are negative except as documented above in ROS and HPI.  ____________________________________________   PHYSICAL EXAM:  VITAL SIGNS: ED Triage Vitals  Enc Vitals Group     BP 10/01/20 0843 117/65     Pulse Rate 10/01/20 0843 84     Resp 10/01/20 0843 16     Temp 10/01/20 0843 98.9 F (37.2 C)     Temp Source 10/01/20 0843 Oral  SpO2 10/01/20 0843 100 %     Weight 10/01/20 0837 139 lb 15.9 oz (63.5 kg)     Height 10/01/20 0837 5' (1.524 m)     Head Circumference --      Peak Flow --      Pain Score 10/01/20 0837 6     Pain Loc --      Pain Edu? --      Excl. in GC? --     Vital signs reviewed, nursing assessments reviewed.   Constitutional:   Alert and oriented. Non-toxic appearance. Eyes:   Conjunctivae are normal. EOMI. PERRL. ENT      Head:   Normocephalic and atraumatic.      Nose:   Wearing a mask.      Mouth/Throat:   Wearing a mask.      Neck:   No meningismus. Full ROM. Hematological/Lymphatic/Immunilogical:    No cervical lymphadenopathy. Cardiovascular:   RRR. Symmetric bilateral radial and DP pulses.  No murmurs. Cap refill less than 2 seconds. Respiratory:   Normal respiratory effort without tachypnea/retractions. Breath sounds are clear and equal bilaterally. No wheezes/rales/rhonchi. Gastrointestinal:   Soft with suprapubic tenderness. Non distended. There is no CVA tenderness.  No rebound, rigidity, or guarding. Genitourinary:   Pelvic exam performed with nurse Colin Mulders at bedside.  No external lesions or rash.  There is cervicitis with thick discharge in the vault.  No significant CMT.  No adnexal tenderness or mass. Musculoskeletal:   Normal range of motion in all extremities. No joint effusions.  No lower extremity tenderness.  No edema. Neurologic:   Normal speech and language.  Motor grossly intact. No acute focal neurologic deficits are appreciated.  Skin:    Skin is warm, dry and intact. No rash noted.  No petechiae, purpura, or bullae.  ____________________________________________    LABS (pertinent positives/negatives) (all labs ordered are listed, but only abnormal results are displayed) Labs Reviewed  WET PREP, GENITAL - Abnormal; Notable for the following components:      Result Value   Yeast Wet Prep HPF POC PRESENT (*)    WBC, Wet Prep HPF POC MODERATE (*)    All other components within normal limits  URINALYSIS, COMPLETE (UACMP) WITH MICROSCOPIC - Abnormal; Notable for the following components:   Color, Urine YELLOW (*)    APPearance HAZY (*)    Bacteria, UA RARE (*)    All other components within normal limits  CHLAMYDIA/NGC RT PCR ([facility] ONLY)  POC URINE PREG, ED   ____________________________________________   EKG    ____________________________________________    RADIOLOGY  No results found.  ____________________________________________   PROCEDURES Procedures  ____________________________________________  DIFFERENTIAL DIAGNOSIS   UTI,  pregnancy, STI, bacterial vaginosis, vaginal candidiasis  CLINICAL IMPRESSION / ASSESSMENT AND PLAN / ED COURSE  Medications ordered in the ED: Medications  azithromycin (ZITHROMAX) tablet 1,000 mg (1,000 mg Oral Given 10/01/20 1038)  cefTRIAXone (ROCEPHIN) injection 500 mg (500 mg Intramuscular Given 10/01/20 1036)  fluconazole (DIFLUCAN) tablet 150 mg (150 mg Oral Given 10/01/20 1037)    Pertinent labs & imaging results that were available during my care of the patient were reviewed by me and considered in my medical decision making (see chart for details).  Owen Goings was evaluated in Emergency Department on 10/01/2020 for the symptoms described in the history of present illness. She was evaluated in the context of the global COVID-19 pandemic, which necessitated consideration that the patient might be at risk for infection with the SARS-CoV-2 virus  that causes COVID-19. Institutional protocols and algorithms that pertain to the evaluation of patients at risk for COVID-19 are in a state of rapid change based on information released by regulatory bodies including the CDC and federal and state organizations. These policies and algorithms were followed during the patient's care in the ED.     Clinical Course as of 10/01/20 1043  Tue Oct 01, 2020  2671 Patient presents with pelvic pain and increased vaginal discharge, LMP about 7 weeks ago.  Will check urinalysis, pregnancy test, pelvic swabs [PS]    Clinical Course User Index [PS] Sharman Cheek, MD    ----------------------------------------- 10:43 AM on 10/01/2020 ----------------------------------------- Urinalysis negative, pregnancy negative.  Wet prep shows white blood cells and yeast, trichomoniasis negative clue cells negative.  GC chlamydia pending.  Patient given IM Rocephin, azithromycin, Diflucan.    ____________________________________________   FINAL CLINICAL IMPRESSION(S) / ED DIAGNOSES    Final diagnoses:   Candida vaginitis     ED Discharge Orders    None      Portions of this note were generated with dragon dictation software. Dictation errors may occur despite best attempts at proofreading.   Sharman Cheek, MD 10/01/20 1043

## 2020-10-01 NOTE — Discharge Instructions (Signed)
Your tests show a vaginal yeast infection.  We gave you a dose of medicine in the ER that should eradicate this infection.

## 2020-12-02 ENCOUNTER — Ambulatory Visit: Payer: Self-pay | Admitting: Family Medicine

## 2022-10-26 ENCOUNTER — Emergency Department: Payer: Self-pay

## 2022-10-26 ENCOUNTER — Other Ambulatory Visit: Payer: Self-pay

## 2022-10-26 ENCOUNTER — Emergency Department
Admission: EM | Admit: 2022-10-26 | Discharge: 2022-10-26 | Disposition: A | Discharge status: Home / Self Care | Payer: Self-pay | Attending: Emergency Medicine | Admitting: Emergency Medicine

## 2022-10-26 DIAGNOSIS — R109 Unspecified abdominal pain: Secondary | ICD-10-CM

## 2022-10-26 DIAGNOSIS — R102 Pelvic and perineal pain: Secondary | ICD-10-CM | POA: Insufficient documentation

## 2022-10-26 LAB — CBC
HCT: 37.8 % (ref 36.0–46.0)
Hemoglobin: 12.1 g/dL (ref 12.0–15.0)
MCH: 30 pg (ref 26.0–34.0)
MCHC: 32 g/dL (ref 30.0–36.0)
MCV: 93.8 fL (ref 80.0–100.0)
Platelets: 226 10*3/uL (ref 150–400)
RBC: 4.03 MIL/uL (ref 3.87–5.11)
RDW: 12.7 % (ref 11.5–15.5)
WBC: 7.2 10*3/uL (ref 4.0–10.5)
nRBC: 0 % (ref 0.0–0.2)

## 2022-10-26 LAB — COMPREHENSIVE METABOLIC PANEL
ALT: 10 U/L (ref 0–44)
AST: 16 U/L (ref 15–41)
Albumin: 3.9 g/dL (ref 3.5–5.0)
Alkaline Phosphatase: 36 U/L — ABNORMAL LOW (ref 38–126)
Anion gap: 7 (ref 5–15)
BUN: 6 mg/dL (ref 6–20)
CO2: 25 mmol/L (ref 22–32)
Calcium: 8.8 mg/dL — ABNORMAL LOW (ref 8.9–10.3)
Chloride: 106 mmol/L (ref 98–111)
Creatinine, Ser: 0.81 mg/dL (ref 0.44–1.00)
GFR, Estimated: >60 mL/min (ref >60)
Glucose, Bld: 106 mg/dL — ABNORMAL HIGH (ref 70–99)
Potassium: 3.9 mmol/L (ref 3.5–5.1)
Sodium: 138 mmol/L (ref 135–145)
Total Bilirubin: 0.9 mg/dL (ref 0.3–1.2)
Total Protein: 7.1 g/dL (ref 6.5–8.1)

## 2022-10-26 LAB — WET PREP, GENITAL
Clue Cells Wet Prep HPF POC: NONE SEEN
Sperm: NONE SEEN
Trich, Wet Prep: NONE SEEN
WBC, Wet Prep HPF POC: <10 (ref <10)
Yeast Wet Prep HPF POC: NONE SEEN

## 2022-10-26 LAB — URINALYSIS, ROUTINE W REFLEX MICROSCOPIC
Bilirubin Urine: NEGATIVE
Glucose, UA: NEGATIVE mg/dL
Hgb urine dipstick: NEGATIVE
Ketones, ur: NEGATIVE mg/dL
Leukocytes,Ua: NEGATIVE
Nitrite: NEGATIVE
Protein, ur: NEGATIVE mg/dL
Specific Gravity, Urine: 1.014 (ref 1.005–1.030)
pH: 7 (ref 5.0–8.0)

## 2022-10-26 LAB — CHLAMYDIA/NGC RT PCR (ARMC ONLY)
Chlamydia Tr: DETECTED — AB
N gonorrhoeae: NOT DETECTED

## 2022-10-26 LAB — LIPASE, BLOOD: Lipase: 27 U/L (ref 11–51)

## 2022-10-26 LAB — PREGNANCY, URINE: Preg Test, Ur: NEGATIVE

## 2022-10-26 MED ORDER — ONDANSETRON 4 MG PO TBDP: 4.0000 mg | ORAL_TABLET | Freq: Three times a day (TID) | ORAL | 0 refills | Status: DC | PRN

## 2022-10-26 MED ORDER — DICYCLOMINE HCL 10 MG PO CAPS: 10.0000 mg | ORAL_CAPSULE | Freq: Three times a day (TID) | ORAL | 0 refills | Status: DC

## 2022-10-26 NOTE — Discharge Instructions (Signed)
Follow-up with your regular after 5 improving to 3 days.  Return emergency department if worsening.  Use Bentyl for abdominal cramping.  Zofran for nausea. Your ultrasound did not show anything concerning.  You may have a area that is bleeding oon the left follicle which is not a concerning finding.  If you develop pain in the left lower quadrant return emergency department

## 2022-10-26 NOTE — ED Provider Notes (Signed)
[facility] Provider Note    Event Date/Time   First MD Initiated Contact with Patient 10/26/22 1344     (approximate)   History   Abdominal Pain   HPI  Karen Harmon is a 32 y.o. female with no significant past medical history presents emergency department complaining of abdominal pain for 1 week with cramping and nausea.  States her menstrual cycle has been regular.  No history of ovarian cyst.  Has had some discharge.  No fever or chills.  Patient still has her gallbladder and appendix      Physical Exam   Triage Vital Signs: ED Triage Vitals [10/26/22 1250]  Enc Vitals Group     BP 122/70     Pulse Rate 84     Resp 16     Temp 98.3 F (36.8 C)     Temp Source Oral     SpO2 98 %     Weight 132 lb (59.9 kg)     Height 5' (1.524 m)     Head Circumference      Peak Flow      Pain Score 7     Pain Loc      Pain Edu?      Excl. in GC?     Most recent vital signs: Vitals:   10/26/22 1250  BP: 122/70  Pulse: 84  Resp: 16  Temp: 98.3 F (36.8 C)  SpO2: 98%     General: Awake, no distress.   CV:  Good peripheral perfusion. regular rate and  rhythm Resp:  Normal effort.  Abd:  No distention.  Tender in the right lower quadrant Other:  Pelvic exam: No external lesions noted, minimal discharge noted, os is closed, bimanual exam does not show any tenderness   ED Results / Procedures / Treatments   Labs (all labs ordered are listed, but only abnormal results are displayed) Labs Reviewed  COMPREHENSIVE METABOLIC PANEL - Abnormal; Notable for the following components:      Result Value   Glucose, Bld 106 (*)    Calcium 8.8 (*)    Alkaline Phosphatase 36 (*)    All other components within normal limits  URINALYSIS, ROUTINE W REFLEX MICROSCOPIC - Abnormal; Notable for the following components:   Color, Urine YELLOW (*)    APPearance CLEAR (*)    All other components within normal limits  WET PREP, GENITAL  CHLAMYDIA/NGC RT PCR  ([facility] ONLY)            LIPASE, BLOOD  CBC  PREGNANCY, URINE     EKG     RADIOLOGY Ultrasound of pelvis    PROCEDURES:   Procedures   MEDICATIONS ORDERED IN ED: Medications - No data to display   IMPRESSION / MDM / ASSESSMENT AND PLAN / ED COURSE  I reviewed the triage vital signs and the nursing notes.                              Differential diagnosis includes, but is not limited to, STD, bacterial vaginosis, pregnancy, ovarian cyst, acute appendicitis  Patient's presentation is most consistent with Patient's presentation is most consistent with acute illness / injury with system symptoms.   Patient's labs are reassuring, STD panel is pending  Ultrasound of the pelvis complete shows a possible hemorrhagic left follicle.  However this not the area of tenderness.  In shared decision-making the patient and I decided not  to do CT.  She states she is mostly just crampy and nauseated.  Not having any pain when ambulating.  States symptoms been ongoing for about a week.  States not really concerned about appendicitis.  However gave her strict instructions to return emergency department if increasing right lower quadrant pain.  Patient is in agreement treatment plan.  She is discharged stable condition.      FINAL CLINICAL IMPRESSION(S) / ED DIAGNOSES   Final diagnoses:  Pelvic pain  Abdominal cramping     Rx / DC Orders   ED Discharge Orders          Ordered    dicyclomine (BENTYL) 10 MG capsule  3 times daily before meals & bedtime        10/26/22 1545    ondansetron (ZOFRAN-ODT) 4 MG disintegrating tablet  Every 8 hours PRN        10/26/22 1545             Note:  This document was prepared using Dragon voice recognition software and may include unintentional dictation errors.    Faythe Ghee, PA-C 10/26/22 1548    Jene Every, MD 10/26/22 [phone]

## 2022-10-26 NOTE — ED Triage Notes (Signed)
Pt c/o abd pain x1 week with cramping and nausea. Pt reports being on time with her menstrual cycle and has not missed a period. Pt denies any medical hx

## 2022-10-27 ENCOUNTER — Telehealth: Payer: Self-pay | Admitting: Emergency Medicine

## 2022-10-27 ENCOUNTER — Telehealth: Payer: Self-pay | Admitting: Physician Assistant

## 2022-10-27 MED ORDER — DOXYCYCLINE HYCLATE 100 MG PO TABS: 100.0000 mg | ORAL_TABLET | Freq: Two times a day (BID) | ORAL | 0 refills | Status: AC

## 2022-10-27 NOTE — Telephone Encounter (Cosign Needed)
Notified by the charge nurse that the patient had a positive test result.  Patient called back to the ED to confirm that she had seen the results on her MyChart patient portal.  Reviewed the patient's chart and did confirm a positive chlamydia by wet prep.  A prescription for doxycycline has been submitted to the patient's pharmacy request which is [facility] in [address]  Press photographer will notify the patient via telephone that her prescription has been submitted on her behalf.  Patient will follow-up with her PCP or [facility] department for any ongoing concerns.  Further advised that her partner should be tested, treated, and symptom free before she engages in any other contact.

## 2022-12-03 ENCOUNTER — Emergency Department
Admission: EM | Admit: 2022-12-03 | Discharge: 2022-12-03 | Disposition: A | Discharge status: Home / Self Care | Payer: No Typology Code available for payment source | Attending: Emergency Medicine | Admitting: Emergency Medicine

## 2022-12-03 ENCOUNTER — Emergency Department: Payer: No Typology Code available for payment source

## 2022-12-03 ENCOUNTER — Other Ambulatory Visit: Payer: Self-pay

## 2022-12-03 DIAGNOSIS — Z3A01 Less than 8 weeks gestation of pregnancy: Secondary | ICD-10-CM | POA: Insufficient documentation

## 2022-12-03 DIAGNOSIS — O00201 Right ovarian pregnancy without intrauterine pregnancy: Secondary | ICD-10-CM | POA: Insufficient documentation

## 2022-12-03 DIAGNOSIS — O26891 Other specified pregnancy related conditions, first trimester: Secondary | ICD-10-CM | POA: Diagnosis present

## 2022-12-03 LAB — CBC WITH DIFFERENTIAL/PLATELET
Abs Immature Granulocytes: 0.02 10*3/uL (ref 0.00–0.07)
Basophils Absolute: 0.1 10*3/uL (ref 0.0–0.1)
Basophils Relative: 1 %
Eosinophils Absolute: 0.2 10*3/uL (ref 0.0–0.5)
Eosinophils Relative: 3 %
HCT: 36.8 % (ref 36.0–46.0)
Hemoglobin: 12.5 g/dL (ref 12.0–15.0)
Immature Granulocytes: 0 %
Lymphocytes Relative: 45 %
Lymphs Abs: 3.3 10*3/uL (ref 0.7–4.0)
MCH: 30.3 pg (ref 26.0–34.0)
MCHC: 34 g/dL (ref 30.0–36.0)
MCV: 89.1 fL (ref 80.0–100.0)
Monocytes Absolute: 0.8 10*3/uL (ref 0.1–1.0)
Monocytes Relative: 10 %
Neutro Abs: 3.1 10*3/uL (ref 1.7–7.7)
Neutrophils Relative %: 41 %
Platelets: 277 10*3/uL (ref 150–400)
RBC: 4.13 MIL/uL (ref 3.87–5.11)
RDW: 12 % (ref 11.5–15.5)
WBC: 7.4 10*3/uL (ref 4.0–10.5)
nRBC: 0 % (ref 0.0–0.2)

## 2022-12-03 LAB — COMPREHENSIVE METABOLIC PANEL
ALT: 12 U/L (ref 0–44)
AST: 17 U/L (ref 15–41)
Albumin: 4.2 g/dL (ref 3.5–5.0)
Alkaline Phosphatase: 41 U/L (ref 38–126)
Anion gap: 8 (ref 5–15)
BUN: 6 mg/dL (ref 6–20)
CO2: 21 mmol/L — ABNORMAL LOW (ref 22–32)
Calcium: 9 mg/dL (ref 8.9–10.3)
Chloride: 103 mmol/L (ref 98–111)
Creatinine, Ser: 0.8 mg/dL (ref 0.44–1.00)
GFR, Estimated: >60 mL/min (ref >60)
Glucose, Bld: 78 mg/dL (ref 70–99)
Potassium: 3.6 mmol/L (ref 3.5–5.1)
Sodium: 132 mmol/L — ABNORMAL LOW (ref 135–145)
Total Bilirubin: 0.9 mg/dL (ref 0.3–1.2)
Total Protein: 7.6 g/dL (ref 6.5–8.1)

## 2022-12-03 LAB — HCG, QUANTITATIVE, PREGNANCY: hCG, Beta Chain, Quant, S: 1760 m[IU]/mL — ABNORMAL HIGH (ref <5)

## 2022-12-03 NOTE — ED Triage Notes (Signed)
Pt arrives via POV w/ c/o intermittent abd pain since Sunday, was seen Tuesday and told she had positive POC preg test, but needs vaginal [place] to rule out ectopic pregnancy. Pt reports nausea and diarrhea as well.

## 2022-12-03 NOTE — ED Provider Notes (Signed)
[facility] Provider Note    Event Date/Time   First MD Initiated Contact with Patient 12/03/22 2011     (approximate)   History   Abdominal Pain and Possible Pregnancy   HPI  Karen Harmon is a 32 y.o. female who reports that she had a positive pregnancy test earlier this week.  Was told that she needs an ultrasound because she is having abdominal cramping.  She reports that cramping is fairly centralized.  No vaginal bleeding.  This is her first pregnancy.     Physical Exam   Triage Vital Signs: ED Triage Vitals  Encounter Vitals Group     BP 12/03/22 1847 111/80     Systolic BP Percentile --      Diastolic BP Percentile --      Pulse Rate 12/03/22 1847 86     Resp 12/03/22 1847 20     Temp 12/03/22 1847 98.4 F (36.9 C)     Temp Source 12/03/22 1847 Oral     SpO2 12/03/22 1847 100 %     Weight 12/03/22 1848 56.8 kg (125 lb 3.2 oz)     Height 12/03/22 1848 1.524 m (5')     Head Circumference --      Peak Flow --      Pain Score 12/03/22 1848 6     Pain Loc --      Pain Education --      Exclude from Growth Chart --     Most recent vital signs: Vitals:   12/03/22 1847  BP: 111/80  Pulse: 86  Resp: 20  Temp: 98.4 F (36.9 C)  SpO2: 100%     General: Awake, no distress.  CV:  Good peripheral perfusion.  Resp:  Normal effort.  Abd:  No distention.  Soft, nontender Other:     ED Results / Procedures / Treatments   Labs (all labs ordered are listed, but only abnormal results are displayed) Labs Reviewed  COMPREHENSIVE METABOLIC PANEL - Abnormal; Notable for the following components:      Result Value   Sodium 132 (*)    CO2 21 (*)    All other components within normal limits  HCG, QUANTITATIVE, PREGNANCY - Abnormal; Notable for the following components:   hCG, Beta Chain, Quant, S 1,760 (*)    All other components within normal limits  CBC WITH DIFFERENTIAL/PLATELET  URINALYSIS, ROUTINE W REFLEX MICROSCOPIC      EKG     RADIOLOGY Contacted by radiologist regarding ultrasound   PROCEDURES:  Critical Care performed:   Procedures   MEDICATIONS ORDERED IN ED: Medications - No data to display   IMPRESSION / MDM / ASSESSMENT AND PLAN / ED COURSE  I reviewed the triage vital signs and the nursing notes. Patient's presentation is most consistent with acute presentation with potential threat to life or bodily function.  Patient presents with pelvic cramping that is mild and intermittent.  Positive pregnancy test at outside facility a few days ago.  It was recommended that she receive an ultrasound.  She has presented today for ultrasound.  She denies vaginal bleeding.  Differential includes IUP, ectopic, threatened miscarriage\\  hCG is 1760  Overall well-appearing and in no distress.  Normal vitals  Ultrasound demonstrates cystic structure in the right ovary with blood flow, I was contacted by the radiologist regarding this.  I did discuss this with Dr. Feliberto Gottron of OB/GYN  He recommends repeat beta on Saturday, follow-up in office with Dr.  Dalbert Garnet or Dr. Jean Rosenthal on Monday.  I discussed this with the patient, she understands the importance of having this done.  She knows to return to the ED immediately if any worsening symptoms.  She is comfortable with this plan        FINAL CLINICAL IMPRESSION(S) / ED DIAGNOSES   Final diagnoses:  Less than [redacted] weeks gestation of pregnancy  Ectopic pregnancy of right ovary     Rx / DC Orders   ED Discharge Orders     None        Note:  This document was prepared using Dragon voice recognition software and may include unintentional dictation errors.   Jene Every, MD 12/03/22 2158

## 2022-12-03 NOTE — Discharge Instructions (Addendum)
As we discussed, we are unclear if you may have an ectopic pregnancy.  It is important to return to the emergency department on Saturday for repeat beta-hCG test (pregnancy hormone blood test).  Return to the ED if any worsening at any time immediately.

## 2023-01-14 ENCOUNTER — Telehealth: Payer: Self-pay

## 2023-01-21 DIAGNOSIS — Z3403 Encounter for supervision of normal first pregnancy, third trimester: Secondary | ICD-10-CM | POA: Insufficient documentation

## 2023-01-29 LAB — OB RESULTS CONSOLE HIV ANTIBODY (ROUTINE TESTING): HIV: NONREACTIVE

## 2023-01-29 LAB — OB RESULTS CONSOLE VARICELLA ZOSTER ANTIBODY, IGG: Varicella: IMMUNE

## 2023-01-29 LAB — OB RESULTS CONSOLE HEPATITIS B SURFACE ANTIGEN: Hepatitis B Surface Ag: NEGATIVE

## 2023-01-29 LAB — OB RESULTS CONSOLE RUBELLA ANTIBODY, IGM: Rubella: IMMUNE

## 2023-01-30 IMAGING — CR DG CHEST 2V
2 series · 2 of 2 positions shown · non-contrast
Comparison: 06/29/2018

CLINICAL DATA: Shortness of breath and chest pain

EXAM:
CHEST - 2 VIEW

[chest lat]
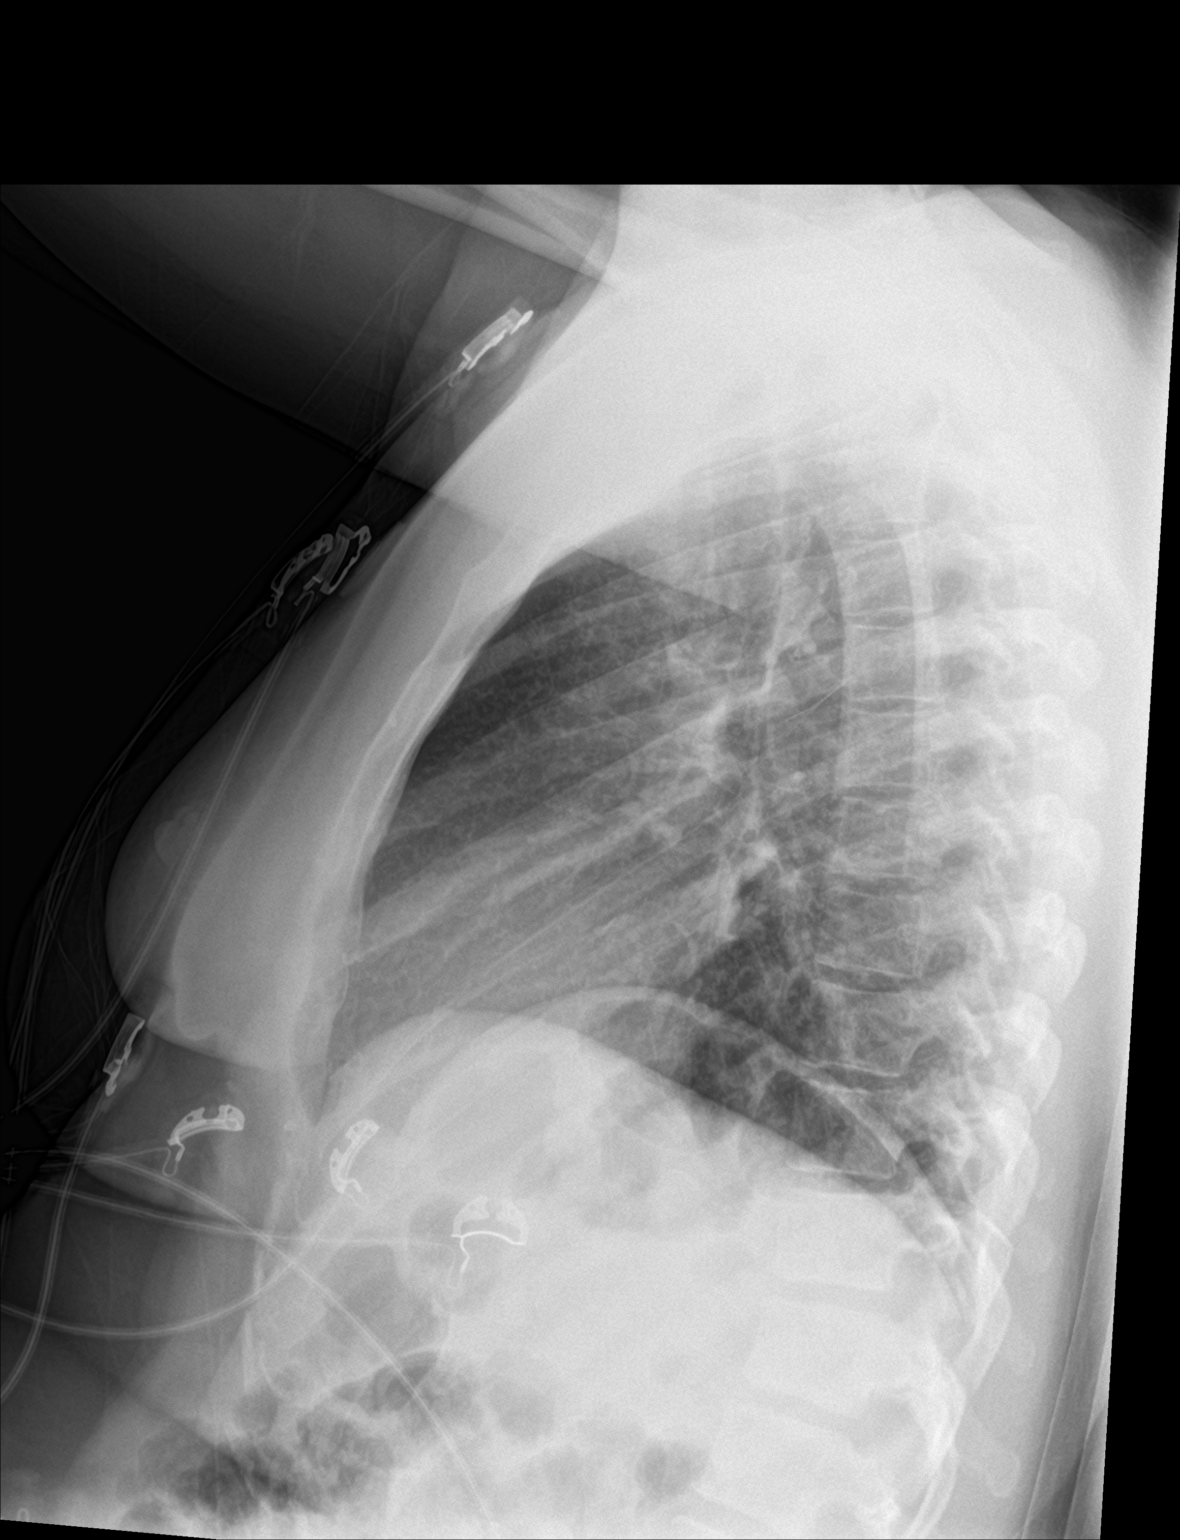

[chest ap]
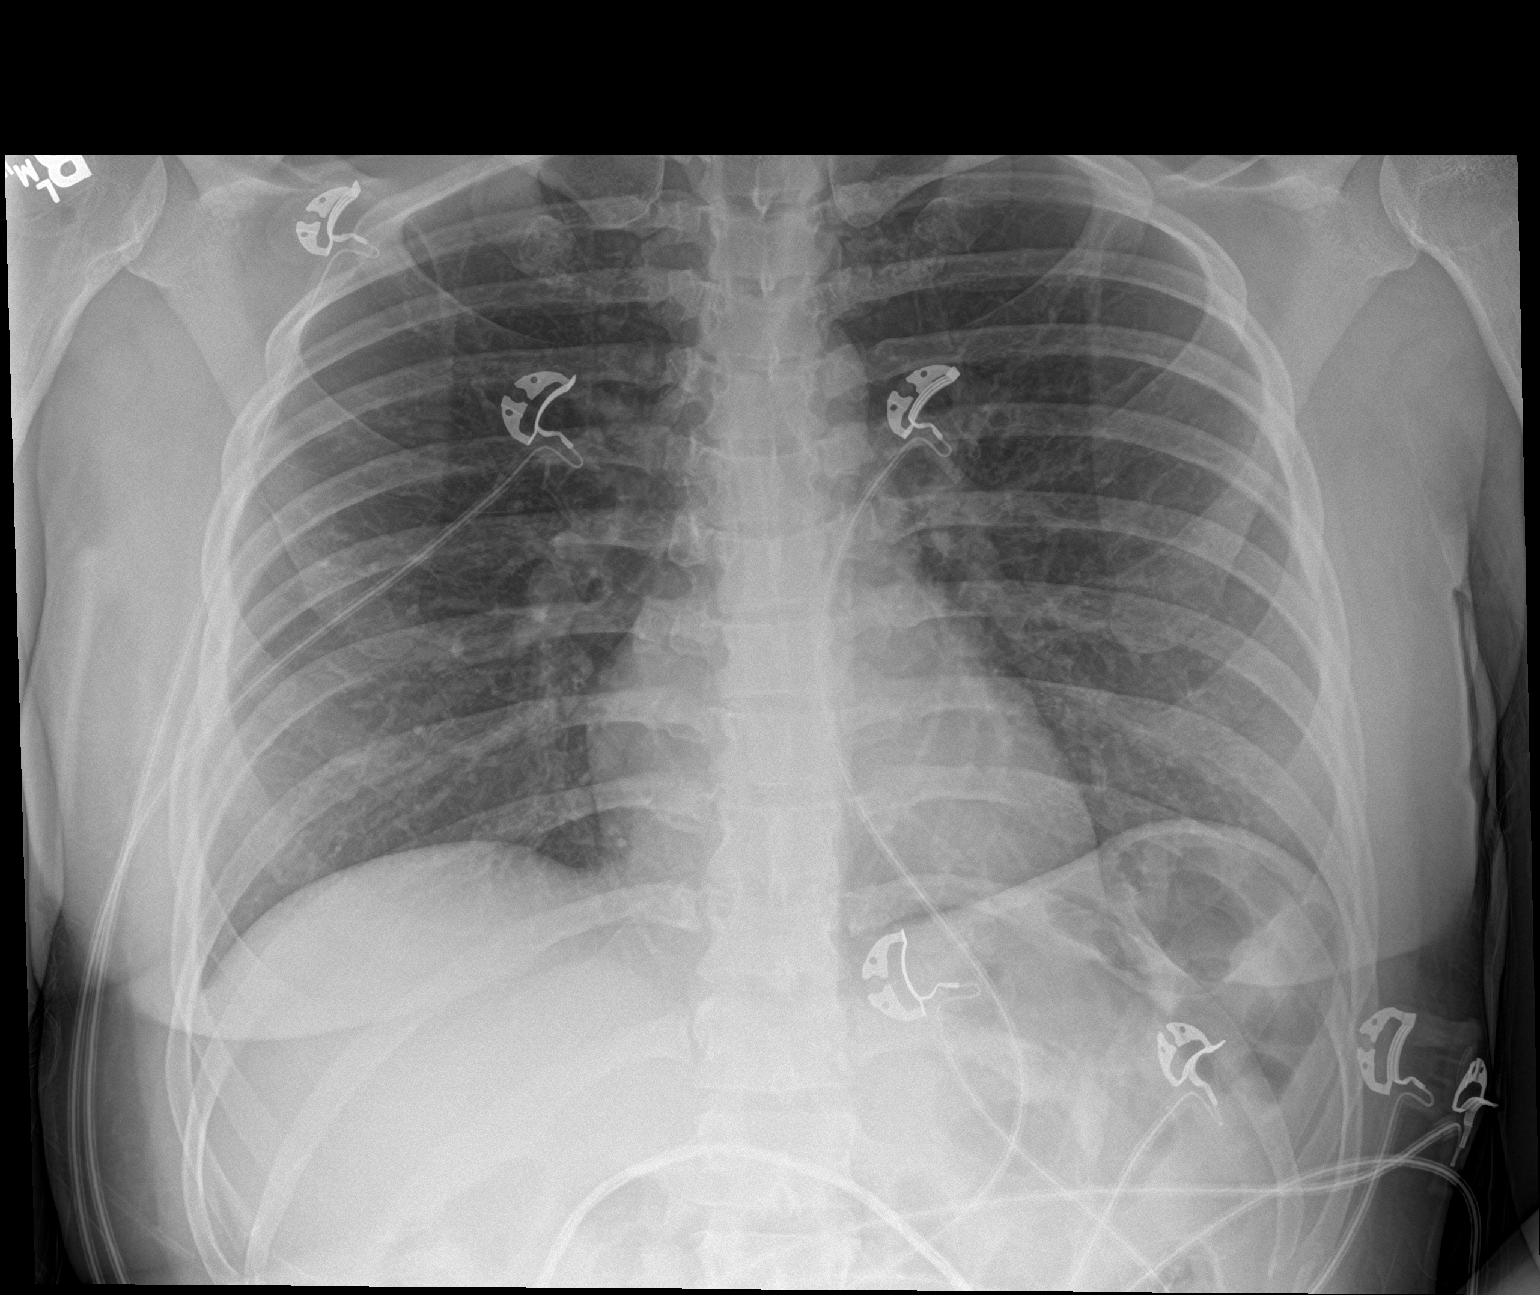

[2 of 2 positions shown; findings below may reference images not displayed]

FINDINGS: The heart size and mediastinal contours are within normal limits.
Both lungs are clear. The visualized skeletal structures are
unremarkable.
IMPRESSION: No active cardiopulmonary disease.

## 2023-03-04 ENCOUNTER — Other Ambulatory Visit: Payer: Self-pay | Admitting: Obstetrics and Gynecology

## 2023-03-04 ENCOUNTER — Telehealth: Payer: Self-pay

## 2023-03-04 DIAGNOSIS — O0992 Supervision of high risk pregnancy, unspecified, second trimester: Secondary | ICD-10-CM

## 2023-03-11 ENCOUNTER — Telehealth: Payer: Self-pay

## 2023-03-12 NOTE — Telephone Encounter (Signed)
Called patient to scheduled appointment for Detailed anatomy and Amnio. Patient stated she is scheduled at her OB OFFICE for detailed ultrasound on 03/19/2023. Patient decline amnio until she goes to her OB office and get a detailed ultrasound.

## 2023-05-05 NOTE — L&D Delivery Note (Signed)
 Delivery Note  Karen Harmon is a G1P0 at [redacted]w[redacted]d with an LMP of 11/01/23, consistent with [place] at [redacted]w[redacted]d.   First Stage: Labor onset: 1457 Induction: oxytocin, AROM, and cervical balloon Analgesia /Anesthesia intrapartum: Epidural Date/time: 08/04/23@ 1457 and Color: clear GBS: Positive/-- (03/12 0000)  IP Antibiotics: abx: PCN x 5 doses  Second Stage: Complete dilation at 2024 Onset of pushing at 2054 FHR second stage Baseline: 130 bpm decels with pushing   Jermany presented to L&D for an elective IOL She was 1/100/0. She progressed  to C/C/+2 with a spontaneous urge to push.  She pushed  effectively over approximately 14 minutes for a spontaneous vaginal birth. Delivery of a viable baby girl on 08/04/2023 . by CNM. Delivery of fetal head in position: Occiput,, Anterior position with restitution to position: Left,, Occiput,, Transverse tight nuchal cord reduced, Anterior then posterior shoulders delivered easily with gentle downward traction. Baby placed on mom's chest, and attended to by baby RN. Cord double clamped after cessation of pulsation, cut by Aunt of baby    Third Stage: Oxytocin bolus started after delivery of infant for hemorrhage prophylaxis  Placenta delivered [facility] intact with 3 VC @ 2114 Placenta disposition: To Pathology: No  Uterine tone firm / exam; vaginal bleeding: moderate  Laceration: 1st degree, 2nd degree, vaginal, and labial laceration identified  Anesthesia for repair: procedures; anesthesia: epidural Repair suture type: 2.0 3.0 vicryl Est. Blood Loss (mL): 275   Complications:Diagnoses; OB-GYN delivery complications: none  Mom to postpartum.  Baby to Couplet care / Skin to Skin.  Newborn: Information for the patient's newborn:  Karen, Harmon [[id number]]  Live born female Karen Harmon  Birth Weight:   APGAR: 8,   Newborn Delivery   Birth date/time: 08/[birth year]/2023 21:08:00 Delivery type: Vaginal, Spontaneous      Feeding planned: breast  feeding  ---------- Chari Manning, CNM Certified Nurse Midwife [place]  [facility]

## 2023-05-19 DIAGNOSIS — Z3402 Encounter for supervision of normal first pregnancy, second trimester: Secondary | ICD-10-CM | POA: Diagnosis not present

## 2023-05-19 DIAGNOSIS — Z23 Encounter for immunization: Secondary | ICD-10-CM | POA: Diagnosis not present

## 2023-06-16 DIAGNOSIS — O26899 Other specified pregnancy related conditions, unspecified trimester: Secondary | ICD-10-CM | POA: Diagnosis not present

## 2023-06-16 DIAGNOSIS — Z3A32 32 weeks gestation of pregnancy: Secondary | ICD-10-CM | POA: Diagnosis not present

## 2023-06-16 DIAGNOSIS — R109 Unspecified abdominal pain: Secondary | ICD-10-CM | POA: Diagnosis not present

## 2023-06-16 DIAGNOSIS — Z23 Encounter for immunization: Secondary | ICD-10-CM | POA: Diagnosis not present

## 2023-06-30 DIAGNOSIS — D509 Iron deficiency anemia, unspecified: Secondary | ICD-10-CM | POA: Diagnosis not present

## 2023-07-14 DIAGNOSIS — Z3403 Encounter for supervision of normal first pregnancy, third trimester: Secondary | ICD-10-CM | POA: Diagnosis not present

## 2023-07-14 LAB — OB RESULTS CONSOLE GC/CHLAMYDIA
Chlamydia: NEGATIVE
Neisseria Gonorrhea: NEGATIVE

## 2023-07-14 LAB — OB RESULTS CONSOLE GBS: GBS: POSITIVE

## 2023-07-20 DIAGNOSIS — N898 Other specified noninflammatory disorders of vagina: Secondary | ICD-10-CM | POA: Diagnosis not present

## 2023-07-20 DIAGNOSIS — O26893 Other specified pregnancy related conditions, third trimester: Secondary | ICD-10-CM | POA: Diagnosis not present

## 2023-08-03 ENCOUNTER — Encounter: Payer: Self-pay | Admitting: Obstetrics

## 2023-08-03 ENCOUNTER — Other Ambulatory Visit: Payer: Self-pay | Admitting: Obstetrics

## 2023-08-03 DIAGNOSIS — Z3490 Encounter for supervision of normal pregnancy, unspecified, unspecified trimester: Secondary | ICD-10-CM

## 2023-08-03 NOTE — Progress Notes (Signed)
 No obstetric history on file. at [redacted]w[redacted]d, LMP of 11/02/22, c/w early [place] at [redacted]w[redacted]d.  Scheduled for Elective at term induction of labor on 08/04/23 @ 0500.   Prenatal provider: [facility] Pregnancy complicated by: SMA carrier GBS pos Anemia Abnormal pap  Prenatal Labs: Blood type/Rh O Pos  Antibody screen neg  Rubella immune    Varicella Immune  RPR NR  HBsAg   neg  Hep C NR  HIV   NR  GC neg  Chlamydia neg  Genetic screening cfDNA negative  1 hour GTT 130  3 hour GTT N/A  GBS   postive   - Tdap vaccine: Given prenatally - Flu vaccine:  declined -RSV vaccine:RSV Given: Given during pregnancy >/=14 days ago  Contraception:Contraceptives: Progesterone only pills Feeding preference: breast feeding  ____ Chari Manning, CNM Certified Nurse Midwife [place]  [facility]

## 2023-08-04 ENCOUNTER — Inpatient Hospital Stay
Admission: EM | Admit: 2023-08-04 | Discharge: 2023-08-06 | DRG: 806 | Disposition: A | Discharge status: Home / Self Care

## 2023-08-04 ENCOUNTER — Inpatient Hospital Stay: Admitting: Anesthesiology

## 2023-08-04 ENCOUNTER — Encounter: Payer: Self-pay | Admitting: Obstetrics and Gynecology

## 2023-08-04 ENCOUNTER — Other Ambulatory Visit: Payer: Self-pay

## 2023-08-04 DIAGNOSIS — Z3A39 39 weeks gestation of pregnancy: Secondary | ICD-10-CM

## 2023-08-04 DIAGNOSIS — Z148 Genetic carrier of other disease: Secondary | ICD-10-CM

## 2023-08-04 DIAGNOSIS — D62 Acute posthemorrhagic anemia: Secondary | ICD-10-CM | POA: Diagnosis not present

## 2023-08-04 DIAGNOSIS — Z950 Presence of cardiac pacemaker: Secondary | ICD-10-CM | POA: Diagnosis not present

## 2023-08-04 DIAGNOSIS — O9081 Anemia of the puerperium: Secondary | ICD-10-CM | POA: Diagnosis not present

## 2023-08-04 DIAGNOSIS — O9903 Anemia complicating the puerperium: Secondary | ICD-10-CM | POA: Diagnosis not present

## 2023-08-04 DIAGNOSIS — Z3490 Encounter for supervision of normal pregnancy, unspecified, unspecified trimester: Secondary | ICD-10-CM

## 2023-08-04 DIAGNOSIS — D72829 Elevated white blood cell count, unspecified: Secondary | ICD-10-CM | POA: Diagnosis not present

## 2023-08-04 DIAGNOSIS — O99824 Streptococcus B carrier state complicating childbirth: Secondary | ICD-10-CM | POA: Diagnosis not present

## 2023-08-04 DIAGNOSIS — O26893 Other specified pregnancy related conditions, third trimester: Secondary | ICD-10-CM | POA: Diagnosis present

## 2023-08-04 LAB — TYPE AND SCREEN
ABO/RH(D): O POS
Antibody Screen: NEGATIVE

## 2023-08-04 LAB — CBC
HCT: 34.8 % — ABNORMAL LOW (ref 36.0–46.0)
Hemoglobin: 11.7 g/dL — ABNORMAL LOW (ref 12.0–15.0)
MCH: 30.1 pg (ref 26.0–34.0)
MCHC: 33.6 g/dL (ref 30.0–36.0)
MCV: 89.5 fL (ref 80.0–100.0)
Platelets: 222 K/uL (ref 150–400)
RBC: 3.89 MIL/uL (ref 3.87–5.11)
RDW: 14.5 % (ref 11.5–15.5)
WBC: 11.5 K/uL — ABNORMAL HIGH (ref 4.0–10.5)
nRBC: 0 % (ref 0.0–0.2)

## 2023-08-04 LAB — ABO/RH: ABO/RH(D): O POS

## 2023-08-04 LAB — RPR: RPR Ser Ql: NONREACTIVE

## 2023-08-04 MED ORDER — WITCH HAZEL-GLYCERIN EX PADS
1.0000 | MEDICATED_PAD | CUTANEOUS | Status: DC | PRN
  Administered 2023-08-04 – 2023-08-05 (×2): 1 via TOPICAL
  Filled 2023-08-04: qty 100
  Filled 2023-08-04: qty 200
  Filled 2023-08-04: qty 100

## 2023-08-04 MED ORDER — OXYTOCIN 10 UNIT/ML IJ SOLN
INTRAMUSCULAR | Status: AC
  Filled 2023-08-04: qty 2

## 2023-08-04 MED ORDER — EPHEDRINE 5 MG/ML INJ: 10.0000 mg | INTRAVENOUS | Status: DC | PRN

## 2023-08-04 MED ORDER — CARBOPROST TROMETHAMINE 250 MCG/ML IM SOLN
INTRAMUSCULAR | Status: AC
  Filled 2023-08-04: qty 1

## 2023-08-04 MED ORDER — DIPHENHYDRAMINE HCL 50 MG/ML IJ SOLN: 12.5000 mg | INTRAMUSCULAR | Status: DC | PRN

## 2023-08-04 MED ORDER — PROPRANOLOL HCL 1 MG/ML IV SOLN
2.0000 mg | Freq: Once | INTRAVENOUS | Status: AC
  Administered 2023-08-04: 2 mg via INTRAVENOUS
  Filled 2023-08-04: qty 2
  Filled 2023-08-04: qty 1

## 2023-08-04 MED ORDER — OXYTOCIN-SODIUM CHLORIDE 30-0.9 UT/500ML-% IV SOLN
1.0000 m[IU]/min | INTRAVENOUS | Status: DC
Start: 2023-08-04 — End: 2023-08-04
  Administered 2023-08-04: 2 m[IU]/min via INTRAVENOUS

## 2023-08-04 MED ORDER — OXYTOCIN BOLUS FROM INFUSION: 333.0000 mL | Freq: Once | INTRAVENOUS | Status: AC

## 2023-08-04 MED ORDER — AMMONIA AROMATIC IN INHA
RESPIRATORY_TRACT | Status: AC
  Filled 2023-08-04: qty 10

## 2023-08-04 MED ORDER — LIDOCAINE HCL (PF) 1 % IJ SOLN
INTRAMUSCULAR | Status: AC
  Filled 2023-08-04: qty 30

## 2023-08-04 MED ORDER — BUPIVACAINE HCL (PF) 0.25 % IJ SOLN
INTRAMUSCULAR | Status: DC | PRN
  Administered 2023-08-04 (×2): 3 mL via EPIDURAL

## 2023-08-04 MED ORDER — IBUPROFEN 600 MG PO TABS
600.0000 mg | ORAL_TABLET | Freq: Four times a day (QID) | ORAL | Status: DC
  Administered 2023-08-05 – 2023-08-06 (×7): 600 mg via ORAL
  Filled 2023-08-04 (×7): qty 1

## 2023-08-04 MED ORDER — PHENYLEPHRINE 80 MCG/ML (10ML) SYRINGE FOR IV PUSH (FOR BLOOD PRESSURE SUPPORT): 80.0000 ug | PREFILLED_SYRINGE | INTRAVENOUS | Status: DC | PRN

## 2023-08-04 MED ORDER — BENZOCAINE-MENTHOL 20-0.5 % EX AERO
1.0000 | INHALATION_SPRAY | CUTANEOUS | Status: DC | PRN
  Administered 2023-08-04 – 2023-08-05 (×2): 1 via TOPICAL
  Filled 2023-08-04 (×3): qty 56

## 2023-08-04 MED ORDER — SOD CITRATE-CITRIC ACID 500-334 MG/5ML PO SOLN: 30.0000 mL | ORAL | Status: DC | PRN

## 2023-08-04 MED ORDER — ACETAMINOPHEN 325 MG PO TABS: 650.0000 mg | ORAL_TABLET | ORAL | Status: DC | PRN

## 2023-08-04 MED ORDER — ONDANSETRON HCL 4 MG/2ML IJ SOLN
4.0000 mg | Freq: Four times a day (QID) | INTRAMUSCULAR | Status: DC | PRN
  Administered 2023-08-04: 4 mg via INTRAVENOUS
  Filled 2023-08-04: qty 2

## 2023-08-04 MED ORDER — OXYTOCIN-SODIUM CHLORIDE 30-0.9 UT/500ML-% IV SOLN
INTRAVENOUS | Status: AC
  Administered 2023-08-04: 333 mL via INTRAVENOUS
  Filled 2023-08-04: qty 500

## 2023-08-04 MED ORDER — PENICILLIN G POT IN DEXTROSE 60000 UNIT/ML IV SOLN
3.0000 10*6.[IU] | INTRAVENOUS | Status: DC
  Administered 2023-08-04 (×3): 3 10*6.[IU] via INTRAVENOUS
  Filled 2023-08-04 (×4): qty 50

## 2023-08-04 MED ORDER — LACTATED RINGERS IV SOLN
500.0000 mL | INTRAVENOUS | Status: DC | PRN
Start: 2023-08-04 — End: 2023-08-04

## 2023-08-04 MED ORDER — ACETAMINOPHEN 325 MG PO TABS
650.0000 mg | ORAL_TABLET | ORAL | Status: DC | PRN
  Administered 2023-08-04 – 2023-08-06 (×4): 650 mg via ORAL
  Filled 2023-08-04 (×4): qty 2

## 2023-08-04 MED ORDER — FENTANYL-BUPIVACAINE-NACL 0.5-0.125-0.9 MG/250ML-% EP SOLN
EPIDURAL | Status: AC
  Filled 2023-08-04: qty 250

## 2023-08-04 MED ORDER — METHYLERGONOVINE MALEATE 0.2 MG/ML IJ SOLN
INTRAMUSCULAR | Status: AC
  Administered 2023-08-04: 0.2 mg via INTRAMUSCULAR
  Filled 2023-08-04: qty 1

## 2023-08-04 MED ORDER — OXYTOCIN-SODIUM CHLORIDE 30-0.9 UT/500ML-% IV SOLN
2.5000 [IU]/h | INTRAVENOUS | Status: DC
  Administered 2023-08-04: 2.5 [IU]/h via INTRAVENOUS

## 2023-08-04 MED ORDER — LACTATED RINGERS IV SOLN: 500.0000 mL | Freq: Once | INTRAVENOUS | Status: DC

## 2023-08-04 MED ORDER — LIDOCAINE HCL (PF) 1 % IJ SOLN
INTRAMUSCULAR | Status: DC | PRN
  Administered 2023-08-04: 3 mL via SUBCUTANEOUS

## 2023-08-04 MED ORDER — LIDOCAINE-EPINEPHRINE (PF) 1.5 %-1:200000 IJ SOLN
INTRAMUSCULAR | Status: DC | PRN
  Administered 2023-08-04: 3 mL via EPIDURAL

## 2023-08-04 MED ORDER — TERBUTALINE SULFATE 1 MG/ML IJ SOLN: 0.2500 mg | Freq: Once | INTRAMUSCULAR | Status: DC | PRN

## 2023-08-04 MED ORDER — CALCIUM CARBONATE ANTACID 500 MG PO CHEW
2.0000 | CHEWABLE_TABLET | ORAL | Status: DC
  Administered 2023-08-04: 400 mg via ORAL
  Filled 2023-08-04: qty 2

## 2023-08-04 MED ORDER — TRANEXAMIC ACID-NACL 1000-0.7 MG/100ML-% IV SOLN
INTRAVENOUS | Status: AC
  Filled 2023-08-04: qty 100

## 2023-08-04 MED ORDER — LIDOCAINE HCL (PF) 1 % IJ SOLN: 30.0000 mL | INTRAMUSCULAR | Status: DC | PRN

## 2023-08-04 MED ORDER — LACTATED RINGERS IV SOLN: INTRAVENOUS | Status: DC

## 2023-08-04 MED ORDER — FENTANYL CITRATE (PF) 100 MCG/2ML IJ SOLN
50.0000 ug | INTRAMUSCULAR | Status: DC | PRN
Start: 2023-08-04 — End: 2023-08-04

## 2023-08-04 MED ORDER — FENTANYL-BUPIVACAINE-NACL 0.5-0.125-0.9 MG/250ML-% EP SOLN
12.0000 mL/h | EPIDURAL | Status: DC | PRN
  Administered 2023-08-04: 12 mL/h via EPIDURAL

## 2023-08-04 MED ORDER — DIBUCAINE (PERIANAL) 1 % EX OINT
1.0000 | TOPICAL_OINTMENT | CUTANEOUS | Status: DC | PRN
  Administered 2023-08-04 – 2023-08-05 (×2): 1 via RECTAL
  Filled 2023-08-04 (×3): qty 28

## 2023-08-04 MED ORDER — SODIUM CHLORIDE 0.9 % IV SOLN
5.0000 10*6.[IU] | Freq: Once | INTRAVENOUS | Status: AC
  Administered 2023-08-04: 5 10*6.[IU] via INTRAVENOUS
  Filled 2023-08-04: qty 5

## 2023-08-04 MED ORDER — MISOPROSTOL 200 MCG PO TABS
ORAL_TABLET | ORAL | Status: AC
  Filled 2023-08-04: qty 4

## 2023-08-04 NOTE — Plan of Care (Signed)
  Problem: Education: Goal: Knowledge of Childbirth will improve Outcome: Completed/Met Goal: Ability to make informed decisions regarding treatment and plan of care will improve Outcome: Completed/Met Goal: Ability to state and carry out methods to decrease the pain will improve Outcome: Completed/Met Goal: Individualized Educational Video(s) Outcome: Completed/Met   Problem: Coping: Goal: Ability to verbalize concerns and feelings about labor and delivery will improve Outcome: Completed/Met   Problem: Life Cycle: Goal: Ability to make normal progression through stages of labor will improve Outcome: Completed/Met Goal: Ability to effectively push during vaginal delivery will improve Outcome: Completed/Met   Problem: Role Relationship: Goal: Will demonstrate positive interactions with the child Outcome: Completed/Met   Problem: Safety: Goal: Risk of complications during labor and delivery will decrease Outcome: Completed/Met   Problem: Pain Management: Goal: Relief or control of pain from uterine contractions will improve Outcome: Completed/Met   Problem: Education: Goal: Knowledge of General Education information will improve Description: Including pain rating scale, medication(s)/side effects and non-pharmacologic comfort measures Outcome: Completed/Met   Problem: Health Behavior/Discharge Planning: Goal: Ability to manage health-related needs will improve Outcome: Completed/Met   Problem: Clinical Measurements: Goal: Ability to maintain clinical measurements within normal limits will improve Outcome: Completed/Met Goal: Will remain free from infection Outcome: Completed/Met Goal: Diagnostic test results will improve Outcome: Completed/Met Goal: Respiratory complications will improve Outcome: Completed/Met Goal: Cardiovascular complication will be avoided Outcome: Completed/Met   Problem: Activity: Goal: Risk for activity intolerance will decrease Outcome:  Completed/Met   Problem: Nutrition: Goal: Adequate nutrition will be maintained Outcome: Completed/Met   Problem: Coping: Goal: Level of anxiety will decrease Outcome: Completed/Met   Problem: Elimination: Goal: Will not experience complications related to bowel motility Outcome: Completed/Met Goal: Will not experience complications related to urinary retention Outcome: Completed/Met   Problem: Pain Managment: Goal: General experience of comfort will improve and/or be controlled Outcome: Completed/Met   Problem: Safety: Goal: Ability to remain free from injury will improve Outcome: Completed/Met   Problem: Skin Integrity: Goal: Risk for impaired skin integrity will decrease Outcome: Completed/Met

## 2023-08-04 NOTE — Plan of Care (Signed)
  Problem: Education: Goal: Knowledge of condition will improve Outcome: Progressing Goal: Individualized Educational Video(s) Outcome: Progressing Goal: Individualized Newborn Educational Video(s) Outcome: Progressing   Problem: Activity: Goal: Will verbalize the importance of balancing activity with adequate rest periods Outcome: Progressing Goal: Ability to tolerate increased activity will improve Outcome: Progressing   Problem: Coping: Goal: Ability to identify and utilize available resources and services will improve Outcome: Progressing   Problem: Life Cycle: Goal: Chance of risk for complications during the postpartum period will decrease Outcome: Progressing   Problem: Role Relationship: Goal: Ability to demonstrate positive interaction with newborn will improve Outcome: Progressing   Problem: Skin Integrity: Goal: Demonstration of wound healing without infection will improve Outcome: Progressing   Problem: Education: Goal: Knowledge of Childbirth will improve Outcome: Completed/Met Goal: Ability to make informed decisions regarding treatment and plan of care will improve Outcome: Completed/Met Goal: Ability to state and carry out methods to decrease the pain will improve Outcome: Completed/Met Goal: Individualized Educational Video(s) Outcome: Completed/Met   Problem: Coping: Goal: Ability to verbalize concerns and feelings about labor and delivery will improve Outcome: Completed/Met   Problem: Life Cycle: Goal: Ability to make normal progression through stages of labor will improve Outcome: Completed/Met Goal: Ability to effectively push during vaginal delivery will improve Outcome: Completed/Met   Problem: Role Relationship: Goal: Will demonstrate positive interactions with the child Outcome: Completed/Met   Problem: Safety: Goal: Risk of complications during labor and delivery will decrease Outcome: Completed/Met   Problem: Pain Management: Goal:  Relief or control of pain from uterine contractions will improve Outcome: Completed/Met   Problem: Education: Goal: Knowledge of General Education information will improve Description: Including pain rating scale, medication(s)/side effects and non-pharmacologic comfort measures Outcome: Completed/Met   Problem: Health Behavior/Discharge Planning: Goal: Ability to manage health-related needs will improve Outcome: Completed/Met   Problem: Clinical Measurements: Goal: Ability to maintain clinical measurements within normal limits will improve Outcome: Completed/Met Goal: Will remain free from infection Outcome: Completed/Met Goal: Diagnostic test results will improve Outcome: Completed/Met Goal: Respiratory complications will improve Outcome: Completed/Met Goal: Cardiovascular complication will be avoided Outcome: Completed/Met   Problem: Activity: Goal: Risk for activity intolerance will decrease Outcome: Completed/Met   Problem: Nutrition: Goal: Adequate nutrition will be maintained Outcome: Completed/Met   Problem: Coping: Goal: Level of anxiety will decrease Outcome: Completed/Met   Problem: Elimination: Goal: Will not experience complications related to bowel motility Outcome: Completed/Met Goal: Will not experience complications related to urinary retention Outcome: Completed/Met   Problem: Pain Managment: Goal: General experience of comfort will improve and/or be controlled Outcome: Completed/Met   Problem: Safety: Goal: Ability to remain free from injury will improve Outcome: Completed/Met   Problem: Skin Integrity: Goal: Risk for impaired skin integrity will decrease Outcome: Completed/Met

## 2023-08-04 NOTE — Progress Notes (Signed)
 L&D Note    Subjective:  Feeling pressure with cooke cath in place  Objective:   Vitals:   08/04/23 1835 08/04/23 1846 08/04/23 1912 08/04/23 1922  BP:  102/75 110/88   Pulse:  (!) 210 (!) 141   Resp:      Temp:    99.1 F (37.3 C)  TempSrc:    Oral  SpO2: 99%     Weight:      Height:        Current Vital Signs 24h Vital Sign Ranges  T 99.1 F (37.3 C) Temp  Avg: 98.2 F (36.8 C)  Min: 97.9 F (36.6 C)  Max: 99.1 F (37.3 C)  BP 110/88 BP  Min: 94/46  Max: 122/78  HR (!) 141 Pulse  Avg: 97.1  Min: 76  Max: 210  RR 18 Resp  Avg: 18  Min: 18  Max: 18  SaO2 99 %   SpO2  Avg: 99.5 %  Min: 98 %  Max: 100 %      Gen: alert, cooperative, no distress FHR: Baseline: 135 bpm, Variability: moderate, Accels: Present, Decels: variable and early Toco: regular, every 2-3 minutes SVE:5/100/0  Medications SCHEDULED MEDICATIONS   carboprost       methylergonovine       oxytocin 40 units in LR 1000 mL  333 mL Intravenous Once    MEDICATION INFUSIONS   fentaNYL 2 mcg/mL w/bupivacaine 0.125% in NS 250 mL 8 mL/hr (08/04/23 1835)   lactated ringers     lactated ringers     lactated ringers 125 mL/hr at 08/04/23 1830   oxytocin     oxytocin 14 milli-units/min (08/04/23 1830)   pencillin G potassium IV 3 Million Units (08/04/23 1830)   tranexamic acid      PRN MEDICATIONS  acetaminophen, carboprost, diphenhydrAMINE, ePHEDrine, ePHEDrine, fentaNYL (SUBLIMAZE) injection, fentaNYL 2 mcg/mL w/bupivacaine 0.125% in NS 250 mL, lactated ringers, lidocaine (PF), methylergonovine, ondansetron, phenylephrine, phenylephrine, sodium citrate-citric acid, terbutaline, tranexamic acid   Assessment & Plan:  33 y.o. G1P0 at [redacted]w[redacted]d admitted for Labor_induction_indication: Elective at term -GBS: positive -IP Antibiotics: abx: PCN x 3 -Membranes ruptured, clear fluid -Recheck:Evaluated by digital exam. -Preeclampsia:  labs stable -Pain: level of pain (1-10, 10 severe), 8/10 -Intervention: cooke  cervical cath out with tug, continue to titrate pitocin -Pe_uterus_labor: Pitocin at 12 mu/min. -Analgesia: position changes    Chari Manning, CNM  08/04/2023 7:39 PM  [facility]

## 2023-08-04 NOTE — H&P (Signed)
 OB History & Physical   History of Present Illness:   Chief Complaint: elective IOL  HPI:  Karen Harmon is a 33 y.o. G1P0 female at [redacted]w[redacted]d, Patient's last menstrual period was 11/01/2022 (approximate)., consistent with [place] at [redacted]w[redacted]d, with Estimated Date of Delivery: 08/09/23.  She presents to L&D for induction of labor due to Elective at term.  Reports active fetal movement  Contractions: denies  LOF/SROM: intact Vaginal bleeding: denies  Factors complicating pregnancy:  SMA carrier GBS pos Anemia Abnormal pap  Patient Active Problem List   Diagnosis Date Noted   Normal labor and delivery 08/04/2023   Chronic daily headache 01/11/2017   Snoring 01/11/2017   Excessive daytime sleepiness 01/11/2017   Pacemaker 01/11/2017    Prenatal Transfer Tool  Maternal Diabetes: No Genetic Screening: Normal Maternal Ultrasounds/Referrals: Normal Fetal Ultrasounds or other Referrals:  None Maternal Substance Abuse:  No Significant Maternal Medications:  None Significant Maternal Lab Results: Group B Strep positive  Maternal Medical History:   Past Medical History:  Diagnosis Date   Frequent headaches    Ovarian cyst     History reviewed. No pertinent surgical history.  No Known Allergies  Prior to Admission medications   Medication Sig Start Date End Date Taking? Authorizing Provider  ferrous sulfate 325 (65 FE) MG EC tablet Take 325 mg by mouth 3 (three) times daily with meals.   Yes [provider]  Prenatal Vit-Fe Fumarate-FA (PRENATAL MULTIVITAMIN) TABS tablet Take 1 tablet by mouth daily at 12 noon.   Yes [provider]  aspirin-acetaminophen-caffeine (EXCEDRIN MIGRAINE) [id number] MG tablet Take by mouth every 6 (six) hours as needed for headache. Patient not taking: Reported on 08/04/2023    [provider]  dicyclomine (BENTYL) 10 MG capsule Take 1 capsule (10 mg total) by mouth 4 (four) times daily -  before meals and at bedtime. Patient not  taking: Reported on 08/04/2023 10/26/22   Faythe Ghee, PA-C  docusate sodium (COLACE) 100 MG capsule Take 1 capsule (100 mg total) by mouth every 12 (twelve) hours. Patient not taking: Reported on 08/04/2023 02/16/17   Dietrich Pates, PA-C  etodolac (LODINE) 400 MG tablet Take 1 tablet (400 mg total) by mouth 2 (two) times daily. Patient not taking: Reported on 08/04/2023 01/11/17   Sater, Pearletha Furl, MD  ibuprofen (ADVIL,MOTRIN) 200 MG tablet Take 400 mg by mouth every 6 (six) hours as needed. Patient not taking: Reported on 08/04/2023    [provider]  ondansetron (ZOFRAN-ODT) 4 MG disintegrating tablet Take 1 tablet (4 mg total) by mouth every 8 (eight) hours as needed. Patient not taking: Reported on 08/04/2023 10/26/22   Faythe Ghee, PA-C  polyethylene glycol [facility] / Ethelene Hal) packet Take 17 g by mouth daily. Patient not taking: Reported on 08/04/2023 02/16/17   Dietrich Pates, PA-C     Prenatal care site:  [facility]  OB History  Gravida Para Term Preterm AB Living  1 0 0 0 0 0  SAB IAB Ectopic Multiple Live Births  0 0 0 0 0    # Outcome Date GA Lbr Len/2nd Weight Sex Type Anes PTL Lv  1 Current              Social History: She  reports that she has never smoked. She has never used smokeless tobacco. She reports that she does not drink alcohol and does not use drugs.  Family History: family history is not on file.   Review of Systems: A  full review of systems was performed and negative except as noted in the HPI.     Physical Exam:  Vital Signs: BP 111/60 (BP Location: Right Arm)   Pulse 89   Temp 98 F (36.7 C) (Oral)   Resp 18   Ht 5' (1.524 m)   Wt 63.5 kg   LMP 11/01/2022 (Approximate)   BMI 27.34 kg/m   General: no acute distress.  HEENT: normocephalic, atraumatic Heart: regular rate & rhythm Lungs: normal respiratory effort Abdomen: soft, gravid, non-tender;  Pelvic:   External: Normal external female genitalia  Cervix: Dilation: 1.5 /  Effacement (%): 100 / Station: 0    Extremities: non-tender, symmetric, non pitting edema bilaterally.  DTRs: +2  Neurologic: Alert & oriented x 3.    Results for orders placed or performed during the hospital encounter of 08/04/23 (from the past 24 hours)  Type and screen     Status: None   Collection Time: 08/04/23  4:49 AM  Result Value Ref Range   ABO/RH(D) O POS    Antibody Screen NEG    Sample Expiration      08/07/2023,2359 Performed at [facility], [address] [place], [place] [address]   CBC     Status: Abnormal   Collection Time: 08/04/23  5:02 AM  Result Value Ref Range   WBC 11.5 (H) 4.0 - 10.5 K/uL   RBC 3.89 3.87 - 5.11 MIL/uL   Hemoglobin 11.7 (L) 12.0 - 15.0 g/dL   HCT 40.9 (L) 81.1 - 91.4 %   MCV 89.5 80.0 - 100.0 fL   MCH 30.1 26.0 - 34.0 pg   MCHC 33.6 30.0 - 36.0 g/dL   RDW 78.2 95.6 - 21.3 %   Platelets 222 150 - 400 K/uL   nRBC 0.0 0.0 - 0.2 %  ABO/Rh     Status: None   Collection Time: 08/04/23  7:26 AM  Result Value Ref Range   ABO/RH(D)      O POS Performed at [facility], [address] [place], [place] [address]     Pertinent Results:  Prenatal Labs:  Blood type/Rh O Pos  Antibody screen neg  Rubella immune    Varicella Immune  RPR NR  HBsAg   neg  Hep C NR  HIV   NR  GC neg  Chlamydia neg  Genetic screening cfDNA negative  1 hour GTT 130  3 hour GTT N/A  GBS   postive     FHT:  FHR: 135 bpm, variability: moderate,  accelerations:  Present,  decelerations:  Absent Category/reactivity:  Category I UC:   irregular, every 2-5 minutes   Cephalic by [place] and SVE   No results found.  Assessment:  Karen Harmon is a 33 y.o. G1P0 female at [redacted]w[redacted]d with anemia, and GBS positive.   Plan:  1. Admit to Labor & Delivery - consents reviewed and obtained - .Dr Beverly Gust MD notified of admission and plan of care   2. Fetal Well being  - Fetal Tracing: category 1 - Group B Streptococcus ppx   indicated: GBS positive - Presentation: cephalic confirmed by SVE   3. Routine OB: - Prenatal labs reviewed, as above - Rh positive - CBC, T&S, RPR on admit - Clear liquid diet , continuous IV fluids  4. Induction of labor  - Contractions monitored with external toco - Pelvis adequate for trial of labor  - Plan for induction with oxytocin and cervical balloon  - Induction with  oxytocin and AROM as appropriate  - Plan for  continuous fetal monitoring - Maternal pain control as desired; planning regional anesthesia, IVPM, nitrous oxide, position changes , and unmedicated labor support options  - Anticipate vaginal delivery  5. Post Partum Planning: - Infant feeding: breast feeding - Contraception: Contraceptives: Progesterone only pills - Tdap vaccine: Given prenatally - Flu vaccine:  declined -RSV vaccine:not received  Chari Manning, CNM 08/04/23 10:57 AM  Chari Manning, CNM Certified Nurse Midwife [facility]

## 2023-08-04 NOTE — Discharge Summary (Signed)
 Postpartum Discharge Summary  Patient Name: Karen Harmon DOB: [birth year] MRN: [id number]  Date of admission: 08/04/2023 Delivery date:08/04/2023 Delivering provider: Chari Manning Date of discharge: 08/06/2023  Primary OB: [facility] [id number] last menstrual period was 11/01/2022 (approximate). EDC Estimated Date of Delivery: 08/09/23 Gestational Age at Delivery: [redacted]w[redacted]d   Admitting diagnosis: Normal labor and delivery [O80] Intrauterine pregnancy: [redacted]w[redacted]d     Secondary diagnosis:   Principal Problem:   Normal labor and delivery   Discharge Diagnosis: Term Pregnancy Delivered      Hospital course: Induction of Labor With Vaginal Delivery   33 y.o. yo G1P1001 at [redacted]w[redacted]d was admitted to the hospital 08/04/2023 for induction of labor.  Indication for induction: Elective.  Patient had an labor course complicated by  an intermittent category 2 tracing and GBS, treated with Penicillin x 5 doses. Membrane Rupture Time/Date: 2:57 PM,08/04/2023  Delivery Method:Vaginal, Spontaneous Operative Delivery:N/A Episiotomy: None Lacerations:  2nd degree;Perineal Details of delivery can be found in separate delivery note.  Patient had a postpartum course without complication. Patient is discharged home 08/06/23.  Newborn Data: Birth date:08/04/2023 Birth time:9:08 PM Gender:Female Aelayah  Living status:Living Apgars:8 ,9  Weight:2820 g                                            Post partum procedures: none Induction:: AROM, Pitocin, and IP Foley Complications: None Delivery Type: spontaneous vaginal delivery Anesthesia: epidural anesthesia Placenta: spontaneous To Pathology: No   Prenatal Labs:   Blood type/Rh O Pos  Antibody screen neg  Rubella immune    Varicella Immune  RPR NR  HBsAg   neg  Hep C NR  HIV   NR  GC neg  Chlamydia neg  Genetic screening cfDNA negative  1 hour GTT 130  3 hour GTT N/A  GBS   postive     Magnesium Sulfate received: No BMZ received:  No Rhophylac:was not indicated MMR: was not indicated Varivax vaccine given: was not indicated - Tdap vaccine: Given prenatally - Flu vaccine:  declined -RSV vaccine:declined  Transfusion:No  Physical exam  Vitals:   08/05/23 1223 08/05/23 1916 08/05/23 2343 08/06/23 0815  BP: 97/63 94/62 110/62 97/66  Pulse: 73 83 75 83  Resp: 18 18 18 18   Temp: 98.4 F (36.9 C) 98 F (36.7 C) 98.4 F (36.9 C) 98 F (36.7 C)  TempSrc: Oral Oral Oral Oral  SpO2: 98% 100% 100% 97%  Weight:      Height:       General: alert, cooperative, and no distress Lochia: appropriate Uterine Fundus: firm Perineum: minimal edema/repair well approximated DVT Evaluation: No evidence of DVT seen on physical exam.  Labs: Lab Results  Component Value Date   WBC 15.0 (H) 08/06/2023   HGB 10.1 (L) 08/06/2023   HCT 29.5 (L) 08/06/2023   MCV 91.0 08/06/2023   PLT 208 08/06/2023      Latest Ref Rng & Units 12/03/2022    6:54 PM  CMP  Glucose 70 - 99 mg/dL 78   BUN 6 - 20 mg/dL 6   Creatinine 2.95 - 6.21 mg/dL 3.08   Sodium 657 - 846 mmol/L 132   Potassium 3.5 - 5.1 mmol/L 3.6   Chloride 98 - 111 mmol/L 103   CO2 22 - 32 mmol/L 21   Calcium 8.9 - 10.3 mg/dL 9.0   Total Protein 6.5 -  8.1 g/dL 7.6   Total Bilirubin 0.3 - 1.2 mg/dL 0.9   Alkaline Phos 38 - 126 U/L 41   AST 15 - 41 U/L 17   ALT 0 - 44 U/L 12    Edinburgh Score:    08/05/2023    6:14 PM  Edinburgh Postnatal Depression Scale Screening Tool  I have been able to laugh and see the funny side of things. 0  I have looked forward with enjoyment to things. 0  I have blamed myself unnecessarily when things went wrong. 1  I have been anxious or worried for no good reason. 2  I have felt scared or panicky for no good reason. 0  Things have been getting on top of me. 1  I have been so unhappy that I have had difficulty sleeping. 1  I have felt sad or miserable. 0  I have been so unhappy that I have been crying. 1  The thought of harming  myself has occurred to me. 0  Edinburgh Postnatal Depression Scale Total 6    Risk assessment for postpartum VTE and prophylactic treatment: Very high risk factors: None High risk factors: None Moderate risk factors: None  Postpartum VTE prophylaxis with LMWH not indicated  After visit meds:  Allergies as of 08/06/2023   No Known Allergies      Medication List     STOP taking these medications    aspirin-acetaminophen-caffeine 250-250-65 MG tablet Commonly known as: EXCEDRIN MIGRAINE   dicyclomine 10 MG capsule Commonly known as: BENTYL   etodolac 400 MG tablet Commonly known as: LODINE   ondansetron 4 MG disintegrating tablet Commonly known as: ZOFRAN-ODT       TAKE these medications    acetaminophen 325 MG tablet Commonly known as: Tylenol Take 2 tablets (650 mg total) by mouth every 6 (six) hours as needed for mild pain (pain score 1-3) or fever (for pain scale < 4).   benzocaine-Menthol 20-0.5 % Aero Commonly known as: DERMOPLAST Apply 1 Application topically as needed for irritation (perineal discomfort).   dibucaine 1 % Oint Commonly known as: NUPERCAINAL Place 1 Application rectally as needed for hemorrhoids (if tucks not working).   docusate sodium 100 MG capsule Commonly known as: COLACE Take 1 capsule (100 mg total) by mouth every 12 (twelve) hours.   ferrous sulfate 325 (65 FE) MG EC tablet Take 325 mg by mouth 3 (three) times daily with meals.   ibuprofen 600 MG tablet Commonly known as: ADVIL Take 1 tablet (600 mg total) by mouth every 6 (six) hours as needed for cramping, mild pain (pain score 1-3) or fever. What changed:  medication strength how much to take reasons to take this   polyethylene glycol 17 g packet Commonly known as: MIRALAX / GLYCOLAX Take 17 g by mouth daily.   prenatal multivitamin Tabs tablet Take 1 tablet by mouth daily at 12 noon.   senna-docusate 8.6-50 MG tablet Commonly known as: Senokot-S Take 2 tablets by  mouth at bedtime as needed for mild constipation.   witch hazel-glycerin pad Commonly known as: TUCKS Apply 1 Application topically as needed for hemorrhoids (for pain).       Discharge home in stable condition Infant Feeding: Breast Infant Disposition:home with mother Discharge instruction: per After Visit Summary and Postpartum booklet. Activity: Advance as tolerated. Pelvic rest for 6 weeks.  Diet: routine diet Anticipated Birth Control: Nexplanon Postpartum Appointment:6 weeks Additional Postpartum F/U:  none Future Appointments:No future appointments. Follow up Visit:  Follow-up  Information     Chari Manning, CNM Follow up in 6 week(s).   Specialty: Obstetrics Why: postpartum visit Contact information: [address] [place] [address] [phone]                 Plan:  Karen Harmon was discharged to home in good condition. Follow-up appointment as directed.    Signed:  Janyce Llanos, CNM 08/06/2023 10:49 AM

## 2023-08-04 NOTE — Anesthesia Preprocedure Evaluation (Signed)
 Anesthesia Evaluation  Patient identified by MRN, date of birth, ID band Patient awake    Reviewed: Allergy & Precautions, H&P , NPO status , Patient's Chart, lab work & pertinent test results  Airway Mallampati: II       Dental   Pulmonary    Pulmonary exam normal        Cardiovascular Normal cardiovascular exam     Neuro/Psych  Headaches  negative psych ROS   GI/Hepatic negative GI ROS, Neg liver ROS,,,  Endo/Other    Renal/GU negative Renal ROS  negative genitourinary   Musculoskeletal   Abdominal   Peds  Hematology negative hematology ROS (+)   Anesthesia Other Findings   Reproductive/Obstetrics                             Anesthesia Physical Anesthesia Plan  ASA: 2  Anesthesia Plan: Epidural   Post-op Pain Management:    Induction:   PONV Risk Score and Plan:   Airway Management Planned:   Additional Equipment:   Intra-op Plan:   Post-operative Plan:   Informed Consent:   Plan Discussed with: Anesthesiologist and CRNA  Anesthesia Plan Comments:        Anesthesia Quick Evaluation

## 2023-08-04 NOTE — Progress Notes (Signed)
 L&D Note    Subjective:  Resting in bed with sister at bedside  Objective:   Vitals:   08/04/23 0459 08/04/23 0520 08/04/23 0730  BP: 110/74  111/60  Pulse: 87  89  Resp: 18  18  Temp: 98.1 F (36.7 C)  98 F (36.7 C)  TempSrc: Oral  Oral  Weight:  63.5 kg   Height:  5' (1.524 m)     Current Vital Signs 24h Vital Sign Ranges  T 98 F (36.7 C) Temp  Avg: 98.1 F (36.7 C)  Min: 98 F (36.7 C)  Max: 98.1 F (36.7 C)  BP 111/60 BP  Min: 110/74  Max: 111/60  HR 89 Pulse  Avg: 88  Min: 87  Max: 89  RR 18 Resp  Avg: 18  Min: 18  Max: 18  SaO2     No data recorded      Gen: alert, cooperative, no distress FHR: Baseline: 145 bpm, Variability: moderate, Accels: Present, Decels: variable and intermittent small Toco: irregular, every 2-3 minutes SVE: Dilation: 1.5 Effacement (%): 100 Station: 0 Presentation: Vertex Exam by:: F Yoshimi Sarr CNM  Medications SCHEDULED MEDICATIONS   ammonia       lidocaine (PF)       misoprostol       oxytocin       oxytocin 40 units in LR 1000 mL  333 mL Intravenous Once   Oxytocin-Sodium Chloride        MEDICATION INFUSIONS   lactated ringers     lactated ringers 125 mL/hr at 08/04/23 0502   oxytocin     oxytocin 6 milli-units/min (08/04/23 1015)   pencillin G potassium IV 3 Million Units (08/04/23 1000)    PRN MEDICATIONS  acetaminophen, ammonia, fentaNYL (SUBLIMAZE) injection, lactated ringers, lidocaine (PF), lidocaine (PF), misoprostol, ondansetron, oxytocin, Oxytocin-Sodium Chloride, sodium citrate-citric acid, terbutaline   Assessment & Plan:  33 y.o. G1P0 at [redacted]w[redacted]d admitted for Labor_induction_indication: Elective at term -GBS: positive  -IP Antibiotics: abx: PCN x 1 -Membranes intact -Recheck:Evaluated by digital exam. -Preeclampsia:  labs stable -Pain: none -Intervention: IV Pitocin induction and insert Cooke Cervical Cath 80/80 -Pe_uterus_labor: Pitocin at 4 mu/min. -Analgesia: regional anesthesia, IVPM, nitrous oxide, and  unmedicated labor support options    Chari Manning, CNM  08/04/2023 11:05 AM  [facility]

## 2023-08-04 NOTE — Progress Notes (Signed)
 L&D Note    Subjective:  Resting in bed with family at bedside  Objective:   Vitals:   08/04/23 0520 08/04/23 0730 08/04/23 1140 08/04/23 1142  BP:  111/60  107/67  Pulse:  89  87  Resp:  18  18  Temp:  98 F (36.7 C) 97.9 F (36.6 C)   TempSrc:  Oral Oral   Weight: 63.5 kg     Height: 5' (1.524 m)       Current Vital Signs 24h Vital Sign Ranges  T 97.9 F (36.6 C) Temp  Avg: 98 F (36.7 C)  Min: 97.9 F (36.6 C)  Max: 98.1 F (36.7 C)  BP 107/67 BP  Min: 107/67  Max: 111/60  HR 87 Pulse  Avg: 87.7  Min: 87  Max: 89  RR 18 Resp  Avg: 18  Min: 18  Max: 18  SaO2     No data recorded      Gen: alert, cooperative, no distress FHR: Baseline: 135 bpm, Variability: moderate, Accels: Present, Decels: none Toco: regular, every 2-3 minutes SVE: Dilation: 1.5 Effacement (%): 100 Station: 0 Presentation: Vertex Exam by:: F Sally-Anne Wamble CNM  Medications SCHEDULED MEDICATIONS   ammonia       lidocaine (PF)       misoprostol       oxytocin       oxytocin 40 units in LR 1000 mL  333 mL Intravenous Once   Oxytocin-Sodium Chloride        MEDICATION INFUSIONS   lactated ringers     lactated ringers 125 mL/hr at 08/04/23 0502   oxytocin     oxytocin 10 milli-units/min (08/04/23 1340)   pencillin G potassium IV 3 Million Units (08/04/23 1000)    PRN MEDICATIONS  acetaminophen, ammonia, fentaNYL (SUBLIMAZE) injection, lactated ringers, lidocaine (PF), lidocaine (PF), misoprostol, ondansetron, oxytocin, Oxytocin-Sodium Chloride, sodium citrate-citric acid, terbutaline   Assessment & Plan:  33 y.o. G1P0 at [redacted]w[redacted]d admitted for Labor_induction_indication: Elective at term -GBS: positive -IP Antibiotics: abx: PCN, 3rd dose infusing -Membranes intact -Recheck:Not evaluated. -Preeclampsia:  labs stable -Pain: level of pain (1-10, 10 severe), 8/10 with contractions -Intervention: increase Pitocin rate, anticipate vaginal delivery, and tug on [place] cervical cath, vaginal balloon out,  cervical balloon remains intact, x 5 hours will evaluate in 1 hour -Pe_uterus_labor: Pitocin at 10 mu/min. and Adequate relaxation between contractions. -Analgesia: position changes , unmedicated labor support options , and peanut ball   Synda Bagent, CNM  08/04/2023 1:48 PM  [facility]

## 2023-08-04 NOTE — Progress Notes (Signed)
 L&D Note    Subjective:  Resting in bed, appears to be sleeping  Objective:   Vitals:   08/04/23 1835 08/04/23 1846 08/04/23 1912 08/04/23 1922  BP:  102/75 110/88   Pulse:  (!) 210 (!) 141   Resp:      Temp:    99.1 F (37.3 C)  TempSrc:    Oral  SpO2: 99%     Weight:      Height:        Current Vital Signs 24h Vital Sign Ranges  T 99.1 F (37.3 C) Temp  Avg: 98.2 F (36.8 C)  Min: 97.9 F (36.6 C)  Max: 99.1 F (37.3 C)  BP 110/88 BP  Min: 94/46  Max: 122/78  HR (!) 141 Pulse  Avg: 97.1  Min: 76  Max: 210  RR 18 Resp  Avg: 18  Min: 18  Max: 18  SaO2 99 %   SpO2  Avg: 99.5 %  Min: 98 %  Max: 100 %      Gen: cooperative, no distress FHR: Baseline: 140 bpm, Variability: moderate, Accels: Present, Decels: none and adjusting monitors  Toco: regular, every 2-4 minutes SVE: Dilation: 6 Effacement (%): 100 Cervical Position: Middle, Anterior Station: Plus 1, 0 Presentation: Vertex Exam by:: Rubye Oaks CNM  Medications SCHEDULED MEDICATIONS   carboprost       methylergonovine       oxytocin 40 units in LR 1000 mL  333 mL Intravenous Once    MEDICATION INFUSIONS   fentaNYL 2 mcg/mL w/bupivacaine 0.125% in NS 250 mL 8 mL/hr (08/04/23 1835)   lactated ringers     lactated ringers     lactated ringers 125 mL/hr at 08/04/23 1830   oxytocin     oxytocin 14 milli-units/min (08/04/23 1830)   pencillin G potassium IV 3 Million Units (08/04/23 1830)   tranexamic acid      PRN MEDICATIONS  acetaminophen, carboprost, diphenhydrAMINE, ePHEDrine, ePHEDrine, fentaNYL (SUBLIMAZE) injection, fentaNYL 2 mcg/mL w/bupivacaine 0.125% in NS 250 mL, lactated ringers, lidocaine (PF), methylergonovine, ondansetron, phenylephrine, phenylephrine, sodium citrate-citric acid, terbutaline, tranexamic acid   Assessment & Plan:  33 y.o. G1P0 at [redacted]w[redacted]d admitted for Labor_induction_indication: Elective at term -GBS: positive -IP Antibiotics: abx: PCN x3 -Membranes ruptured, clear  fluid -Recheck:Evaluated by digital exam. -Preeclampsia:  labs stable -Pain: none -Intervention: increase Pitocin rate, change maternal position, anticipate vaginal delivery. -Pe_uterus_labor: Pitocin at 14 mu/min. and Adequate relaxation between contractions. -Analgesia: regional anesthesia   Chari Manning, CNM  08/04/2023  [facility]

## 2023-08-04 NOTE — Anesthesia Procedure Notes (Signed)
 Epidural Patient location during procedure: OB Start time: 08/04/2023 4:17 PM End time: 08/04/2023 4:28 PM  Staffing Anesthesiologist: Piscitello, Cleda Mccreedy, MD Resident/CRNA: Clydia Llano, CRNA Performed: resident/CRNA   Preanesthetic Checklist Completed: patient identified, IV checked, site marked, risks and benefits discussed, surgical consent, monitors and equipment checked, pre-op evaluation and timeout performed  Epidural Patient position: sitting Prep: ChloraPrep Patient monitoring: heart rate, continuous pulse ox and blood pressure Approach: midline Location: L3-L4 Injection technique: LOR air  Needle:  Needle type: Tuohy  Needle gauge: 17 G Needle length: 9 cm and 9 Needle insertion depth: 6 cm Catheter type: closed end flexible Catheter size: 19 Gauge Catheter at skin depth: 12 cm Test dose: negative and 1.5% lidocaine with Epi 1:200 K  Assessment Events: blood not aspirated, no cerebrospinal fluid, injection not painful, no injection resistance, no paresthesia and negative IV test  Additional Notes 1 attempt Pt. Evaluated and documentation done after procedure finished. Patient identified. Risks/Benefits/Options discussed with patient including but not limited to bleeding, infection, nerve damage, paralysis, failed block, incomplete pain control, headache, blood pressure changes, nausea, vomiting, reactions to medication both or allergic, itching and postpartum back pain. Confirmed with bedside nurse the patient's most recent platelet count. Confirmed with patient that they are not currently taking any anticoagulation, have any bleeding history or any family history of bleeding disorders. Patient expressed understanding and wished to proceed. All questions were answered. Sterile technique was used throughout the entire procedure. Please see nursing notes for vital signs. Test dose was given through epidural catheter and negative prior to continuing to dose epidural or  start infusion. Warning signs of high block given to the patient including shortness of breath, tingling/numbness in hands, complete motor block, or any concerning symptoms with instructions to call for help. Patient was given instructions on fall risk and not to get out of bed. All questions and concerns addressed with instructions to call with any issues or inadequate analgesia.    Patient tolerated the insertion well without immediate complications.Reason for block:procedure for pain

## 2023-08-05 LAB — CBC
HCT: 29.4 % — ABNORMAL LOW (ref 36.0–46.0)
Hemoglobin: 9.9 g/dL — ABNORMAL LOW (ref 12.0–15.0)
MCH: 31 pg (ref 26.0–34.0)
MCHC: 33.7 g/dL (ref 30.0–36.0)
MCV: 92.2 fL (ref 80.0–100.0)
Platelets: 174 10*3/uL (ref 150–400)
RBC: 3.19 MIL/uL — ABNORMAL LOW (ref 3.87–5.11)
RDW: 14.3 % (ref 11.5–15.5)
WBC: 20.4 10*3/uL — ABNORMAL HIGH (ref 4.0–10.5)
nRBC: 0 % (ref 0.0–0.2)

## 2023-08-05 MED ORDER — ONDANSETRON HCL 4 MG PO TABS
4.0000 mg | ORAL_TABLET | ORAL | Status: DC | PRN
Start: 2023-08-05 — End: 2023-08-06

## 2023-08-05 MED ORDER — ZOLPIDEM TARTRATE 5 MG PO TABS: 5.0000 mg | ORAL_TABLET | Freq: Every evening | ORAL | Status: DC | PRN

## 2023-08-05 MED ORDER — FLEET ENEMA RE ENEM: 1.0000 | ENEMA | Freq: Every day | RECTAL | Status: DC | PRN

## 2023-08-05 MED ORDER — SENNOSIDES-DOCUSATE SODIUM 8.6-50 MG PO TABS
2.0000 | ORAL_TABLET | ORAL | Status: DC
  Administered 2023-08-05 – 2023-08-06 (×2): 2 via ORAL
  Filled 2023-08-05 (×2): qty 2

## 2023-08-05 MED ORDER — PRENATAL MULTIVITAMIN CH
1.0000 | ORAL_TABLET | Freq: Every day | ORAL | Status: DC
  Administered 2023-08-05 – 2023-08-06 (×2): 1 via ORAL
  Filled 2023-08-05 (×2): qty 1

## 2023-08-05 MED ORDER — COCONUT OIL OIL
1.0000 | TOPICAL_OIL | Status: DC | PRN
  Filled 2023-08-05: qty 7.5

## 2023-08-05 MED ORDER — SIMETHICONE 80 MG PO CHEW: 80.0000 mg | CHEWABLE_TABLET | ORAL | Status: DC | PRN

## 2023-08-05 MED ORDER — ONDANSETRON HCL 4 MG/2ML IJ SOLN: 4.0000 mg | INTRAMUSCULAR | Status: DC | PRN

## 2023-08-05 MED ORDER — DIPHENHYDRAMINE HCL 25 MG PO CAPS: 25.0000 mg | ORAL_CAPSULE | Freq: Four times a day (QID) | ORAL | Status: DC | PRN

## 2023-08-05 MED ORDER — OXYCODONE HCL 5 MG PO TABS: 5.0000 mg | ORAL_TABLET | ORAL | Status: DC | PRN

## 2023-08-05 MED ORDER — BISACODYL 10 MG RE SUPP: 10.0000 mg | Freq: Every day | RECTAL | Status: DC | PRN

## 2023-08-05 NOTE — Progress Notes (Signed)
 Postpartum Day  1  Subjective: 33 y.o. G1P1001 postpartum day #1 status post normal spontaneous vaginal delivery. She is ambulating, is tolerating po, is voiding spontaneously.  Her pain is well controlled on PO pain medications. Her lochia is less than menses.  Objective: BP 96/61 (BP Location: Right Arm)   Pulse 67   Temp 97.7 F (36.5 C) (Oral)   Resp 18   Ht 5' (1.524 m)   Wt 63.5 kg   LMP 11/01/2022 (Approximate)   SpO2 99%   Breastfeeding Unknown   BMI 27.34 kg/m    Physical Exam:  General: alert, cooperative, and no distress Breasts: soft/nontender Pulm: nl effort Abdomen: soft, non-tender, active bowel sounds Uterine Fundus: firm Perineum: minimal edema, repair well approximated Lochia: appropriate DVT Evaluation: No evidence of DVT seen on physical exam.  Recent Labs    08/04/23 0502 08/05/23 0446  HGB 11.7* 9.9*  HCT 34.8* 29.4*  WBC 11.5* 20.4*  PLT 222 174    Assessment/Plan: 32 y.o. G1P1001 postpartum day # 1  1. Continue routine postpartum care  2. Infant feeding status: breast feeding -Lactation consult PRN for breastfeeding   3. Contraception plan: oral progesterone-only contraceptive  4. Acute blood loss anemia - clinically significant.  -Hemodynamically stable and asymptomatic -Intervention: start on oral supplementation with ferrous sulfate 325 mg   5. Immunization status:   all immunizations up to date  6. Acute leukocytosis  -Afebrile and asymptomatic -Will repeat CBC in AM  Disposition: continue inpatient postpartum care , plan for discharge home tomorrow    LOS: 1 day   Gustavo Lah, CNM 08/05/2023, 12:09 PM   ----- Margaretmary Eddy  Certified Nurse Midwife [facility]

## 2023-08-05 NOTE — Lactation Note (Signed)
 This note was copied from a baby's chart. Lactation Consultation Note  Patient Name: Karen Harmon [id number] Date: 08/05/2023 Age:31 hours Reason for consult: Initial assessment;Primapara;Term   Maternal Data Has patient been taught Hand Expression?: No Does the patient have breastfeeding experience prior to this delivery?: No  Initial assessment w/ a P1 patient and a 13hr old baby Karen.  This was a SVD.  Patient w/ a hx of anemia and pacemaker.  Feeding goal is breastfeeding/formula feeding.  Patient stated upon visit that she has a manual breast pump.  Patient stated that she does have WIC.   Patient verbalized to [facility] that she wanted to start pumping.  Infant has already had 1 bottle of formula.  Patient stated that she felt infant was hungry and gave 8ml. Patients only concern is that her nipples are inverted and that she is using a nipple shield.  She states that the nipple shield continues to fold up in infants face.   Feeding Mother's Current Feeding Choice: Breast Milk and Formula Nipple Type: Slow - flow  No feeding observed during this visit.  Lactation Tools Discussed/Used Tools: Pump Breast pump type: Double-Electric Breast Pump  A DEBP was provided in the room, but education and set up has not been done.    Interventions Interventions: Breast feeding basics reviewed;DEBP;Education  LC provided education on the following;  milk production expectations, hunger cues, day 1/2 wet/dirty diapers, hand expression, cluster feeding, benefits of STS and arousing infant for a feeding.  Lactation informed patient of feeding infant at least 8 or more times w/in a 24hr period but not exceeding 3hrs. Patient verbalized understanding.   LC provided education on pumping and making sure she understands that just because she starts pumping doesn't mean that you will collect a huge volume of milk.  Patient verbalized understanding.    LC offered to assist patient w/ the next feeding at the  breast.  Encouraged patient to call out to lactation for support.   Discharge Pump: Personal;Manual WIC Program: Yes  Consult Status Consult Status: Follow-up Follow-up type: In-patient    Yvette Rack Xzayvion Vaeth 08/05/2023, 10:33 AM

## 2023-08-05 NOTE — Anesthesia Postprocedure Evaluation (Signed)
 Anesthesia Post Note  Patient: Karen Harmon  Procedure(s) Performed: AN AD HOC LABOR EPIDURAL  Patient location during evaluation: Mother Baby Anesthesia Type: Epidural Level of consciousness: awake and alert and oriented Pain management: pain level controlled Vital Signs Assessment: post-procedure vital signs reviewed and stable Respiratory status: respiratory function stable Cardiovascular status: stable Postop Assessment: no headache, no backache, able to ambulate, adequate PO intake, patient able to bend at knees and no apparent nausea or vomiting Anesthetic complications: no   No notable events documented.   Last Vitals:  Vitals:   08/05/23 0220 08/05/23 0508  BP: 100/61 102/70  Pulse: 74 80  Resp: 17 17  Temp: 37.5 C 36.9 C  SpO2: 99%     Last Pain:  Vitals:   08/05/23 0551  TempSrc:   PainSc: 0-No pain                 Margret Moat Madilyn Fireman

## 2023-08-05 NOTE — Lactation Note (Signed)
 This note was copied from a baby's chart. Lactation Consultation Note  Patient Name: Karen Harmon [id number] Date: 08/05/2023 Age:31 hours Reason for consult: Follow-up assessment;Primapara;Term;Other (Comment) (Pump Education/Set-up)   Maternal Data Has patient been taught Hand Expression?: Yes Does the patient have breastfeeding experience prior to this delivery?: No  LC called to room to assist patient w/ pumping.    Feeding Mother's Current Feeding Choice: Breast Milk and Formula  A feeding session at the breast was attempted after a pump session.  Infant did not latch.    Patient has been using a nipple shield.  I attempted the feeding without the nipple shield.  Patient has erect/inverted nipples.   Lactation Tools Discussed/Used Tools: Pump Breast pump type: Double-Electric Breast Pump Pump Education: Setup, frequency, and cleaning Reason for Pumping: Patient stated that she wanted to start pumping. Pumped volume: 0 mL  Patient is using a 21mm flange.  The left nipple swole up after the pumping session.  LC recommended that patient try a 24mm flange if she decides to pump again.  Patient verbalized understanding.   Interventions Interventions: Breast feeding basics reviewed;Assisted with latch;Breast massage;Hand express;Support pillows;DEBP;Education  Education was provided to patient on how to use the DEBP.  Patient wanted to start pumping.  She feels that infant is not getting enough even after education by [facility].   Discharge Pump: Personal;Manual WIC Program: Yes  Consult Status Consult Status: Follow-up Follow-up type: In-patient    Yvette Rack [name] 08/05/2023, 12:30 PM

## 2023-08-06 ENCOUNTER — Other Ambulatory Visit (HOSPITAL_COMMUNITY): Payer: Self-pay

## 2023-08-06 LAB — CBC
HCT: 29.5 % — ABNORMAL LOW (ref 36.0–46.0)
Hemoglobin: 10.1 g/dL — ABNORMAL LOW (ref 12.0–15.0)
MCH: 31.2 pg (ref 26.0–34.0)
MCHC: 34.2 g/dL (ref 30.0–36.0)
MCV: 91 fL (ref 80.0–100.0)
Platelets: 208 10*3/uL (ref 150–400)
RBC: 3.24 MIL/uL — ABNORMAL LOW (ref 3.87–5.11)
RDW: 14.3 % (ref 11.5–15.5)
WBC: 15 10*3/uL — ABNORMAL HIGH (ref 4.0–10.5)
nRBC: 0 % (ref 0.0–0.2)

## 2023-08-06 MED ORDER — IBUPROFEN 600 MG PO TABS
600.0000 mg | ORAL_TABLET | Freq: Four times a day (QID) | ORAL | 0 refills | Status: AC | PRN
  Filled 2023-08-06: qty 30, 8d supply, fill #0

## 2023-08-06 MED ORDER — WITCH HAZEL-GLYCERIN EX PADS
1.0000 | MEDICATED_PAD | CUTANEOUS | 0 refills | Status: AC | PRN
Start: 2023-08-06 — End: ?
  Filled 2023-08-06: qty 40, fill #0

## 2023-08-06 MED ORDER — SENNOSIDES-DOCUSATE SODIUM 8.6-50 MG PO TABS
2.0000 | ORAL_TABLET | Freq: Every evening | ORAL | 0 refills | Status: AC | PRN
Start: 2023-08-06 — End: ?
  Filled 2023-08-06: qty 15, 7d supply, fill #0

## 2023-08-06 MED ORDER — ACETAMINOPHEN 325 MG PO TABS
650.0000 mg | ORAL_TABLET | Freq: Four times a day (QID) | ORAL | 0 refills | Status: AC | PRN
  Filled 2023-08-06: qty 30, 4d supply, fill #0

## 2023-08-06 MED ORDER — DIBUCAINE (PERIANAL) 1 % EX OINT
1.0000 | TOPICAL_OINTMENT | CUTANEOUS | 1 refills | Status: AC | PRN
Start: 2023-08-06 — End: ?
  Filled 2023-08-06: qty 56, 30d supply, fill #0

## 2023-08-06 MED ORDER — BENZOCAINE-MENTHOL 20-0.5 % EX AERO
1.0000 | INHALATION_SPRAY | CUTANEOUS | 0 refills | Status: AC | PRN
Start: 2023-08-06 — End: ?
  Filled 2023-08-06: qty 85, 30d supply, fill #0

## 2023-08-06 NOTE — Progress Notes (Signed)
 Patient discharged. Discharge instructions given. Patient verbalizes understanding. Transported by axillary.

## 2023-08-06 NOTE — Discharge Instructions (Signed)
Discharge instructions Bleeding: Your bleeding could continue up to 6 weeks, the flow should gradually decrease and the color should become dark then lightened over the next couple of weeks. If you notice you are bleeding heavily or passing clots larger than the size of your fist, PLEASE call your physician. No TAMPONS, DOUCHING, ENEMAS OR SEXUAL INTERCOURSE for 6 weeks. Stitches: Shower daily with mild soap and water. Stitches will dissolve over the next couple of weeks, if you experience any discomfort in the vaginal area you may sit in warm water 15-20 minutes, 3-4 times per day. Just enough water to cover vaginal area. AfterPains: This is the uterus contracting back to its normal position and size. Use medications prescribed or recommended by your physician to help relieve this discomfort. Bowels/Hemorrhoids: Drink plenty of water and stay active. Increase fiber, fresh fruits and vegetables in your diet. Rest/Activity: Rest when the baby is resting;  Do not lift > 10 lbs for 6 weeks. No driving for 1-2 weeks. Bathing: Shower daily! Diet: Continue daily prenatal vitamin and iron until your follow up visit to help replenish nutrients and vitamins. If breastfeeding eat extra calories and increase your fluid intake to 12 glasses a day. Contraception: Consult with your provider on what method of birth control you would like to use. Breastfeeding: You may have a slight fever when your milk comes in, but it should go away on its own. If it does not, and rises above 101.0 please call the doctor. Bottlefeeding: wear a snug fitting bra without underwires continuously for 3-5days, avoid any nipple/breast stimulation. If engorgement occurs, take ibuprofen as prescribed and apply fresh green cabbage leaves directly to your breasts inside the bra cups. Postpartum "BLUES": It is common to emotional days after delivery, however if it persist for greater than 2 weeks or if you feel concerned please let your physician  know immediately. This is hormone driven and nothing you can control so please let someone know how you feel. Follow Up Visit: Please schedule a follow up visit with your delivering provider  Call office if you have any of the following: headache, visual changes, fever >101.0 F, chills, breast concerns, excessive vaginal bleeding, incision drainage or problems, leg pain or redness, depression or any other concerns.  For concerns about your baby, please call your pediatrician For breastfeeding concerns, the lactation consultant can be reached at [phone]

## 2023-08-06 NOTE — Lactation Note (Signed)
 This note was copied from a baby's chart. Lactation Consultation Note  Patient Name: Karen Harmon [id number] Date: 08/06/2023 Age:31 hours Reason for consult: Difficult latch;Nipple pain/trauma;Primapara   Maternal Data Has patient been taught Hand Expression?: Yes  Feeding Nipple Type: Slow - flow  LATCH Score Latch: Repeated attempts needed to sustain latch, nipple held in mouth throughout feeding, stimulation needed to elicit sucking reflex.  Audible Swallowing: A few with stimulation  Type of Nipple: Inverted  Comfort (Breast/Nipple): Filling, red/small blisters or bruises, mild/mod discomfort  Hold (Positioning): Assistance needed to correctly position infant at breast and maintain latch.  LATCH Score: 4   Lactation Tools Discussed/Used Tools: Supplemental Nutrition System;Bottle;Nipple Dorris Carnes;Medicine Dropper Nipple shield size: 24  Interventions Interventions: Breast feeding basics reviewed;Assisted with latch;Support pillows;Adjust position;Coconut oil  Attempted latch on right and left side in cradle hold onto erect inverted (dimpled) nipples. Baby would briefly latch and then unlatch. After 10-15 minutes of attempts, mom requested to use 24mm nipple shield. Baby continued pattern of latching and unlatching. More interested in sustaining latch once drops of formula were added at the corner of the mouth. SNS initiated. Baby sustained latch for 15-20 minutes with SNS under the shield. Mom was asked numerous times if she felt comfortable with the interventions. She stated she was pleased with the SNS and felt it was sustainable until her supply increases.Shown how to set up and clean SNS. Coconut oil applied to nipples post feeding for soreness. Baby took approximately 5mL from SNS at breast.  Discharge Discharge Education: Warning signs for feeding baby Pump:  (did not want to pump) WIC Program: Yes  Consult Status Consult Status: Follow-up Follow-up type:  In-patient    Matt Holmes 08/06/2023, 12:52 AM

## 2023-08-06 NOTE — Lactation Note (Signed)
 This note was copied from a baby's chart. Lactation Consultation Note  Patient Name: Karen Harmon [id number] Date: 08/06/2023 Age:31 hours Reason for consult: Follow-up assessment;Term;Primapara;Difficult latch   Maternal Data This is mom's 1st baby, SVD. Mom with history of anemia, daytime sleepiness and pacemaker.  On follow-up visit today mom reports she has been offering baby to breast and supplementing post breastfeeding. Per mom baby will start off breastfeeding with use of nipple shield but doesn't always continue. Mom offers supplemental formula post breastfeeding as baby isn't settled after breastfeeding. Mom reports she has been pumping. Per mom her focus is on pumping to establish her milk supply with a goal once her milk is flowing is to breastfeed.  Has patient been taught Hand Expression?: Yes Does the patient have breastfeeding experience prior to this delivery?: No  Feeding Mother's Current Feeding Choice: Breast Milk (Mother's choice remains breastmilk. As baby is not consistently latching r/t mom's inverted nipples despite use of nipple shield and mom pumping with limited expression she is bridging with formula and continuing to work on breastfeeding and pumping.)    Lactation Tools Discussed/Used  Discussed tips and strategies to maximize use of pump to establish milk supply. Mom in contact with WIC to obtain DEBP for home use.  Interventions Interventions: Breast feeding basics reviewed;Education;Hand pump;DEBP  Discharge Discharge Education: Engorgement and breast care;Warning signs for feeding baby;Outpatient recommendation Pump: [facility] [employer] [facility] Program: Yes  Consult Status Consult Status: Complete Date: 08/06/23 Follow-up type: In-patient  Update provided to care nurse.  Fuller Song 08/06/2023, 12:01 PM

## 2023-08-09 ENCOUNTER — Other Ambulatory Visit (HOSPITAL_COMMUNITY): Payer: Self-pay

## 2023-08-16 ENCOUNTER — Other Ambulatory Visit (HOSPITAL_COMMUNITY): Payer: Self-pay

## 2023-09-15 DIAGNOSIS — Z3202 Encounter for pregnancy test, result negative: Secondary | ICD-10-CM | POA: Diagnosis not present

## 2023-09-29 DIAGNOSIS — F53 Postpartum depression: Secondary | ICD-10-CM | POA: Diagnosis not present

## 2023-09-29 DIAGNOSIS — L02421 Furuncle of right axilla: Secondary | ICD-10-CM | POA: Diagnosis not present

## 2023-10-10 DIAGNOSIS — Z3483 Encounter for supervision of other normal pregnancy, third trimester: Secondary | ICD-10-CM | POA: Diagnosis not present

## 2023-10-10 DIAGNOSIS — Z3482 Encounter for supervision of other normal pregnancy, second trimester: Secondary | ICD-10-CM | POA: Diagnosis not present

## 2023-10-29 DIAGNOSIS — O924 Hypogalactia: Secondary | ICD-10-CM | POA: Diagnosis not present

## 2023-10-29 DIAGNOSIS — Z9189 Other specified personal risk factors, not elsewhere classified: Secondary | ICD-10-CM | POA: Diagnosis not present

## 2023-10-29 DIAGNOSIS — F53 Postpartum depression: Secondary | ICD-10-CM | POA: Diagnosis not present

## 2023-11-26 DIAGNOSIS — F53 Postpartum depression: Secondary | ICD-10-CM | POA: Diagnosis not present

## 2024-01-11 DIAGNOSIS — F53 Postpartum depression: Secondary | ICD-10-CM | POA: Diagnosis not present

## 2024-01-28 DIAGNOSIS — Z1151 Encounter for screening for human papillomavirus (HPV): Secondary | ICD-10-CM | POA: Diagnosis not present

## 2024-01-28 DIAGNOSIS — Z124 Encounter for screening for malignant neoplasm of cervix: Secondary | ICD-10-CM | POA: Diagnosis not present
